# Patient Record
Sex: Female | Born: 1980 | Race: Black or African American | Hispanic: No | Marital: Married | Smoking: Never smoker
Health system: Southern US, Community
[De-identification: ages and names within clinical notes are randomized; demographics above are authoritative.]

## PROBLEM LIST (undated history)

## (undated) DIAGNOSIS — R7303 Prediabetes: Secondary | ICD-10-CM

## (undated) DIAGNOSIS — E785 Hyperlipidemia, unspecified: Secondary | ICD-10-CM

## (undated) DIAGNOSIS — N189 Chronic kidney disease, unspecified: Secondary | ICD-10-CM

## (undated) DIAGNOSIS — E559 Vitamin D deficiency, unspecified: Secondary | ICD-10-CM

## (undated) DIAGNOSIS — O139 Gestational [pregnancy-induced] hypertension without significant proteinuria, unspecified trimester: Secondary | ICD-10-CM

## (undated) HISTORY — DX: Vitamin D deficiency, unspecified: E55.9

## (undated) HISTORY — PX: BREAST SURGERY: SHX581

## (undated) HISTORY — DX: Prediabetes: R73.03

## (undated) HISTORY — DX: Chronic kidney disease, unspecified: N18.9

## (undated) HISTORY — PX: BREAST REDUCTION SURGERY: SHX8

## (undated) HISTORY — DX: Hyperlipidemia, unspecified: E78.5

---
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
## 2001-11-01 ENCOUNTER — Emergency Department (HOSPITAL_COMMUNITY): Admission: EM | Admit: 2001-11-01 | Discharge: 2001-11-01 | Payer: Self-pay | Admitting: *Deleted

## 2002-09-05 ENCOUNTER — Emergency Department (HOSPITAL_COMMUNITY): Admission: EM | Admit: 2002-09-05 | Discharge: 2002-09-05 | Payer: Self-pay | Admitting: Emergency Medicine

## 2006-02-15 ENCOUNTER — Ambulatory Visit: Payer: Self-pay | Admitting: *Deleted

## 2006-02-19 ENCOUNTER — Ambulatory Visit: Payer: Self-pay | Admitting: Obstetrics & Gynecology

## 2006-02-22 ENCOUNTER — Ambulatory Visit (HOSPITAL_COMMUNITY): Admission: RE | Admit: 2006-02-22 | Discharge: 2006-02-22 | Payer: Self-pay | Admitting: Obstetrics & Gynecology

## 2006-03-01 ENCOUNTER — Ambulatory Visit: Payer: Self-pay | Admitting: Gynecology

## 2006-03-15 ENCOUNTER — Ambulatory Visit: Payer: Self-pay | Admitting: Gynecology

## 2006-04-12 ENCOUNTER — Ambulatory Visit (HOSPITAL_COMMUNITY): Admission: RE | Admit: 2006-04-12 | Discharge: 2006-04-12 | Payer: Self-pay | Admitting: Obstetrics and Gynecology

## 2006-04-26 ENCOUNTER — Inpatient Hospital Stay (HOSPITAL_COMMUNITY): Admission: AD | Admit: 2006-04-26 | Discharge: 2006-05-05 | Payer: Self-pay | Admitting: Obstetrics and Gynecology

## 2006-04-29 ENCOUNTER — Encounter (INDEPENDENT_AMBULATORY_CARE_PROVIDER_SITE_OTHER): Payer: Self-pay | Admitting: Specialist

## 2006-04-29 ENCOUNTER — Ambulatory Visit: Payer: Self-pay | Admitting: Neonatology

## 2008-03-19 ENCOUNTER — Other Ambulatory Visit: Admission: RE | Admit: 2008-03-19 | Discharge: 2008-03-19 | Payer: Self-pay | Admitting: Gynecology

## 2010-02-25 ENCOUNTER — Other Ambulatory Visit: Admission: RE | Admit: 2010-02-25 | Discharge: 2010-02-25 | Payer: Self-pay | Admitting: Obstetrics & Gynecology

## 2010-02-25 ENCOUNTER — Ambulatory Visit: Payer: Self-pay | Admitting: Obstetrics & Gynecology

## 2011-02-17 NOTE — Op Note (Signed)
NAMEGEORGETTE, Sarah Ayala              ACCOUNT NO.:  [id number]   MEDICAL RECORD NO.:  [id number]          PATIENT TYPE:  INP   LOCATION:  9371                          [facility]ILITY:  [facility]   PHYSICIAN:  Janine Limbo, M.D.DATE OF BIRTH:  Oct 19, 1980   DATE OF PROCEDURE:  04/29/2006  DATE OF DISCHARGE:                                 OPERATIVE REPORT   PREOPERATIVE DIAGNOSES:  1.  A 27-week and 2-day gestation.  2.  Twins.  3.  Intrauterine fetal demise of twin B.  4.  Severe preeclampsia.  5.  Previous cesarean section.   POSTOPERATIVE DIAGNOSES:  1.  A 27-week and 2-day gestation.  2.  Twins.  3.  Intrauterine fetal demise of twin B.  4.  Severe preeclampsia.  5.  Previous cesarean section.   PROCEDURE:  Repeat low transverse cesarean section.   SURGEON:  Janine Limbo, M.D.   FIRST ASSISTANT:  Philipp Deputy, certified nurse midwife.   ANESTHETIC:  Spinal.   DISPOSITION:  Sarah Ayala is a 30 year old female, gravida 2, para 0-1-0-1,  who was admitted to the [facility] [place] on April 26, 2006.  The patient has been followed at the [facility] and  [facility].  She presented to the  office on that day, and an ultrasound showed an intrauterine fetal demise of  the second twin.  She was noted to have proteinuria and hypertension.  A  diagnosis of preeclampsia was given.  The patient had preeclampsia with her  first pregnancy.  At 29 weeks' gestation, she had a cesarean delivery  because of a nonreassuring fetal heart rate tracing.  The patient was  admitted and was given betamethasone.  She was started on magnesium.  Her 24-  hour urine protein returned showing 8645 mg of protein per 24 hours.  Her  creatinine clearance was 89.  On April 29, 2006, the patient was felt to have  received significant benefit from her betamethasone.  Because of the  assigned diagnosis of severe preeclampsia and because of our  concern for  compromise of her renal function, and because of our concern for the well  being of the patient's living fetus, we recommended that the patient proceed  at this time with cesarean delivery.  The risks and benefits of each of her  options were outlined.  The patient agreed to proceed with cesarean  delivery.  The specific risks of C-section were reviewed including, but not  limited to, anesthetic complications, bleeding, infections, and possible  damage to the surrounding organs.   FINDINGS:  The first infant delivered was a 736-gram (1 pound 9.9 ounce)  female, named [place].  The Apgars were 5 at 1 minute and 8 at 5 minutes.  The infant was delivered from a cephalic presentation.  The second infant  delivered was a female that was nonviable.  The Apgars were 0 and 0.  The  infant had already begun to have breakdown of the skin.  Grossly, there  appeared to be a slight abnormality to the facies of the second infant.  The  uterus, fallopian tubes, and the ovaries appeared normal.   PROCEDURE:  The patient was taken to the operating room where a spinal  anesthetic was given.  The patient's abdomen was prepped with multiple  layers of Betadine and then sterilely draped.  A Foley catheter had  previously been placed.  The lower abdomen was injected with 10 mL of 0.5%  Marcaine with epinephrine.  A low transverse incision was made and carried  sharply through the subcutaneous tissue, the fascia, and the anterior  peritoneum.  The bladder flap was developed.  An incision was made in the  lower uterine segment and extended transversely.  The membranes were  ruptured, and the fetus was delivered from a cephalic presentation.  The  cord was clamped and cut.  The infant was handed to the waiting pediatric  team.  Routine cord blood studies were obtained.  The second infant was then  delivered after membranes were ruptured.  There was no foul smell to the  fluid.  The infant was noted  to be an intrauterine fetal demise, and the  skin was already noted to have some breaking down.  The infant was placed to  the side, so that mom could see the infant at a later time.  The placenta  was removed.  The uterine cavity was cleaned of amniotic fluid, clotted  blood, and membranes.  The uterine incision was closed using a running  locking suture of 2-0 Vicryl followed by an imbricating suture of 2-0  Vicryl.  The pelvis was vigorously irrigated.  Hemostasis was adequate.  The  anterior peritoneum and abdominal musculature were reapproximated in the  midline using 2-0 Vicryl.  The fascia was irrigated.  The fascia was closed  using a running suture of 0 Vicryl, followed by three interrupted sutures of  0 Vicryl.  The subcutaneous layer was closed using a running suture of 0  Vicryl.  The skin was reapproximated using a subcuticular suture of 3-0  Monocryl.  Sponge, needle, and instrument counts were correct on two  occasions.  The estimated blood loss for the procedure was 600 mL.  The  patient tolerated her procedure well.  The viable infant was sent to the  neonatal intensive care nursery in stable but guarded condition.  The infant  that was an intrauterine fetal demise was taken to the operating suite so  that mom could view the baby at a later time.  The placenta was sent to  pathology for evaluation.  The patient was noted to drain clear yellow  urine.      Janine Limbo, M.D.  Electronically Signed     AVS/MEDQ  D:  04/30/2006  T:  04/30/2006  Job:  [id number]

## 2011-02-17 NOTE — Discharge Summary (Signed)
NAMEARNITA, Ayala              ACCOUNT NO.:  [id number]   MEDICAL RECORD NO.:  [id number]          PATIENT TYPE:  INP   LOCATION:  9371                          [facility]   PHYSICIAN:  Hal Morales, M.D.DATE OF BIRTH:  [birth year]   DATE OF ADMISSION:  04/26/2006  DATE OF DISCHARGE:  05/05/2006                                 DISCHARGE SUMMARY   ADMITTING DIAGNOSES:  1. Twin intrauterine pregnancy at 26-6/7 weeks.  2. Fetal demise of twin B.  3. Preeclampsia.  4. History of severe preeclampsia.  5. History of previous low transverse cesarean section.   DISCHARGE DIAGNOSES:  1. Twin intrauterine pregnancy at 26-6/7 weeks.  2. Fetal demise of twin B.  3. Preeclampsia.  4. History of severe preeclampsia.  5. History of previous low transverse cesarean section.  6. Thrombocytopenia  7. Persistent elevated blood pressure, possible chronic hypertension.   PROCEDURE:  Repeat low transverse cesarean section.   HOSPITAL COURSE:  [name] is a 30 year old gravida 2, para 0-1-0-1, who  was admitted at 102 weeks and 6 days gestation for evaluation secondary to 3+  proteinuria on clean catch urine specimen at [facility] office,  as well as a twin pregnancy with IUFD of twin B. The patient's blood  pressure in the office was reported to be 130s/80s.  Due to the patient's  history of severe preeclampsia, the patient was sent maternity admissions  for further evaluation of the proteinuria as well as serial blood pressures.  The patient's pregnancy has been remarkable for 1) twin gestation, 2) IUFD  of twin B, 3) history of severe preeclampsia at 29 weeks, 4) history of  prior cesarean section.   ADMISSION LABORATORY:  Cath urinalysis was greater than 300 mg of protein.  Normal PT and PTT.  CBC values - white blood cell count is 10.5, hemoglobin  11.6, hematocrit 34.3, platelets 216,000.  Complete metabolic panel was  within normal limits with the exception of  glucose elevated at 107 and  creatinine at 1.4.  SGOT was slightly elevated at 46, SGPT was normal at 30.  LDH elevated at 361 and uric acid was 7.1.   Blood pressure in maternity admissions was in the 140s to 150s/90 to 110.  The plan at that time was admission with magnesium sulfate therapy as well  as betamethasone x2, 12 hours apart.  The discussion took place with the  parents regarding probable need for early delivery to be based on daily  laboratory evaluation as well as maternal blood pressure control and fetal  status.  On hospital day #1, the patient's blood pressures continued to be  elevated averaging approximately 145/100.  Her weight was 256.5 pounds.  On  the day of admission was 246.  Urine output ranged between 25 and 350 mL per  hour.  Laboratory values on day 1 were similar to admission laboratories.  D-  dimer was elevated at 2.23.  By hospital day #2, laboratory values remained  stable.  Prothrombin II was negative.  ANA value was negative.  Fetal heart  rate remained stable.  The patient  had received both of her betamethasone  doses.  Urine output remained between 50 and 150 mL per hour.  Her 24-hour  urine was almost finished being collected.  By hospital day 3 her magnesium  sulfate was at 1 gram per hour.  The patient was in a positive fluid  balance.  Weight was 258 pounds at that point.  Blood sugars were slightly  elevated.  Fasting was 101.  Postprandial values were in the 120s.  Fetal  heart rate was stable.  A 24-hour urine protein was 8645 mg in the 24 hours  specimen.  Creatinine clearance was 89.  That afternoon a discussion was  held with the patient and her husband about management options.  Due to her  severe preeclampsia and elevated creatinine and BUN, it was recommended that  she proceed with cesarean section for delivery due to the patient's desire  for a repeat C-section as delivery mode.  The risks and benefits were reviewed with the patient and  they accepted the  risks associated with pre-term C-section delivery.  Infant was a viable  female by the name of Sarah Ayala, weight was 736 grams, Apgars were 5 and 1.  Twin B was delivered as well during the C-section as an IUFD of a female  infant with weight of 660 grams.  The patient tolerated the C-section well  and was taken to the recovery room in good condition.  Twin A was taken to  the NICU and was stable.  By postop day #1, which was hospital day #4, the  patient was feeling well.  Blood pressures remained elevated 140-158/85-108.  She was still in a positive fluid balance.  Hemoglobin was 11.9 and had been  11.5 preoperatively.  Magnesium level was 6.  Patient was receiving IV  labetalol for blood pressure control and was 149/104 after 4 doses.  HCTZ  was given concurrently as well for blood pressure control.  By postop day  #2, which was hospital day #5, the patient was feeling well.  The baby was  stable in the NICU.  Temperature max was 100.1.  Blood pressure in the 130-  160s/90-110s.  Weight was 260 pounds.  Mag sulfate was continued at 1.5 gram  per hour.  Platelets had dropped to 103,000 and had been 152,000 the day  prior.  She was positive 660 mL of fluid in the past 24 hours.  By postop  day #3, patient had been ambulating and sitting in the chair.  She denied  signs and symptoms of preeclampsia.  Infant remained stable in the NICU.  Blood pressures were 135-169/86-108.  The patient still requiring IV  labetalol for blood pressure control.  Daily weight was 258 pounds.  Platelets are 100,000.  SGOT was slightly elevated from the day prior at 55  and had been 49.  Magnesium was at 1 gram per hour and labetalol p.o. dosing  was 200 mg b.i.d.  The patient was given permission to visit the infant in  the NICU.  By postop day #4, the patient was able to ambulate without  difficulty and infant was taken off the ventilator and was doing well in the NICU.  Blood pressures were  129-156/84-108 still requiring periodic doses of  IV labetalol.  Fluid balance was negative, liter and a half in the past 24  hours.  Platelets 106,000.  Magnesium was to be continued at 1 gram with  repeat lab work in the morning.  By postop day #5, the  patient was feeling  well and was wanting to go home.  Blood pressures were in the 140-160/90-  100s.  Platelets 112,000.  SGOT was 66.  SGPT was 50.  LDH was 477, which  was a decrease from the day prior which was 486.  At this point, it was  decided that magnesium would be discontinued.  The p.o. labetalol dose was  to be increased to 300 mg b.i.d.  By postop day #6, which was hospital day  #9, the patient was feeling well.  Reported the NICU infant was also doing  well, still remaining off the ventilator.  The patient was pumping breast  milk.  Blood pressure was 138/92.  Her incision was healing well.  Orthostatic vial signs; lying 153/105 with pulse of 97, sitting 155/108 and  pulse of 90, standing 154/107 and pulse of 98.  Weight was 251.5 pounds and  had been 257.5 the day prior.  Fluid balance was negative, 1200 mL.  Blood  pressure medication had been changed to Procardia 20 mg q.8 h and 1 dose of  HCTZ was held on August 3 and that was started back.  Patient was deemed to  have received the full benefit of her hospital stay and was discharged home.   DISCHARGE INSTRUCTIONS:  Per [facility] handout as well as  preeclampsia precautions are reviewed with the patient.   DISCHARGE MEDICATIONS:  1. Motrin 600 mg, 1 p.o. q.6 h., p.r.n. for pain.  2. Tylox 1-2 p.o. q.3-4h., p.r.n. for pain.  3. Procardia 20 mg, 1 p.o. q.6 h.  4. HCTZ 25 mg, 1 p.o. daily.  5. Prenatal vitamin 1 p.o. daily.   DISCHARGE FOLLOW UP:  Will occur in 4 days at [facility] for  blood pressure check with Dr. Pennie Rushing.      Cam Hai, C.N.M.      Hal Morales, M.D.  Electronically Signed    KS/MEDQ  D:  05/05/2006  T:   05/05/2006  Job:  [id number]

## 2011-02-17 NOTE — Op Note (Signed)
[facility]  Patient:    Sarah Ayala, Sarah Ayala                     MRN: [id number] Proc. Date: 11/01/00 Adm. Date:  [id number] Attending:  Tammi Sou                           Operative Report  PREOPERATIVE DIAGNOSIS:       Intrauterine pregnancy at 29-1/[redacted] weeks gestation complicated by severe preeclampsia and repetitive fetal heart rate decelerations.  POSTOPERATIVE DIAGNOSIS:      Intrauterine pregnancy at 29-1/[redacted] weeks gestation complicated by severe preeclampsia and repetitive fetal heart rate decelerations.  OPERATION:                    Low transverse cesarean.  SURGEON:                      Conni Elliot, M.D.  ANESTHESIA:                   Spinal.  OPERATIVE FINDINGS:           A 2 lb 1 oz female with Apgars of 4 and 7.  Cord pH was 7.27.  The placenta was sent to pathology.  OPERATIVE PROCEDURE:          Under satisfactory spinal anesthetic with the patient supine left lateral tilt position receiving oxygen, the abdomen was prepped and draped in a sterile fashion.  A low transverse Pfannenstiel incision was made and extended down through the skin and fascia.  The rectus muscles were separated in the midline.  A bladder flap was created.  A low transverse uterine incision was made.  The baby was delivered from the vertex position without difficulty.  The cord was doubly clamped and cut and the baby handed to the neonatologist in attendance.  The placenta delivered spontaneously.  The uterus, bladder flap, anterior peritoneum, fascia and the skin was closed in normal fashion.  Estimated blood loss was approximately 1000 cc without replacement. DD:  11/01/00 TD:  11/01/00 Job: [id number]

## 2011-02-17 NOTE — Discharge Summary (Signed)
[facility]  Patient:    Sarah Ayala, Sarah Ayala                     MRN: [id number] Adm. Date:  [id number] Disc. Date: [id number] Attending:  Tammi Sou Dictator:   Dallas Breeding, M.D.                           Discharge Summary  DISCHARGE DIAGNOSES:          1. Status post low transverse cesarean section.                               2. Severe preeclampsia, resolved.                               3. Mild hypertension.  PROCEDURES:                   Status post low transverse cesarean section.                               Ultrasound on October 30, 2000.  CONSULTS:                     None.  SIGNIFICANT LABORATORIES:     Hematocrit at discharge 28.8. LFTs are within normal limits as is uric acid.  HISTORY AND PHYSICAL:         Please see dictated.  HOSPITAL COURSE:              An 30 year old, G1, P0, at 57 weeks by LMP consistent with second trimester ultrasound, referred from [facility] where she was noted to have a 24-pound weight gain in the last month, elevated blood pressures, and 3+ proteinuria by dipstick. Initial PIH labs were within normal limits. She was admitted to the AICU where she was begun on a magnesium drip. On January 29 the patient had an ultrasound that demonstrated an AFI of 10.8 and absent diastolic flow. On January 30 her 24-hour urine protein was 3.18 grams, and blood pressures remained stable. On January 31 the patient began to have late decelerations and thus, a cesarean was performed. A viable female infant was delivered at 2 pounds 1 ounce with Apgars of 4 and 7. Cord pH was 7.27.  The patient was continued on 24 hours of magnesium postoperatively with brisk diuresis and continued normalization of pressures (systolics 130s to 140s, diastolics 70s to 80s). At the time of discharge, the patient is asymptomatic and with continued high normal to mildly hypertensive blood pressures. At the time of  discharge, her baby is in the NICU and is extubated. Regarding birth control, the patient desires Depo-Provera and this will be arranged.  DISCHARGE MEDICATIONS:        1. Ibuprofen 600 mg q.6h. p.r.n.                               2. Iron sulfate 325 mg q.d.                               3. Prenatal vitamins q.d.  4. HCTZ one half tablet q.a.m. x 2 weeks.  DISCHARGE INSTRUCTIONS:       Routine. The patient will have blood pressures checked at MAU when visiting her child and house officer will be contacted for blood pressure greater than 145/90.  FOLLOWUP:                     Follow up at [facility] in six weeks. DD:  11/04/00 TD:  11/04/00 Job: [id number]

## 2011-02-17 NOTE — H&P (Signed)
[name], [name]              ACCOUNT NO.:  [id number]   MEDICAL RECORD NO.:  [id number]          PATIENT TYPE:  INP   LOCATION:  9371                          [facility]ILITY[facility]  [facility]   PHYSICIAN:  Sarah Ayala Ayala, M.D.DATE OF BIRTH:  [birth year]   DATE OF ADMISSION:  04/26/2006  DATE OF DISCHARGE:                                HISTORY & PHYSICAL   HISTORY OF PRESENT ILLNESS:  [name] Ayala is a 30 year old gravida 2, para 0-1-  0-1, who is admitted at 70 weeks and 6 days' gestation for evaluation  secondary to 3+ proteinuria on clean-catch urine specimen at [facility] office, as  well as twin pregnancy with IUFD of twin B.  The patient's blood pressure in  the office was reported to be 130s over 80s.  Patient with a history of  severe preeclampsia; therefore, the patient was sent to Maternity Admissions  for pregnancy-induced hypertension labs and serial blood pressures.  Upon  admission to Maternity Admissions, blood pressure was noted to be in the  150s to 160s with diastolics in the 100s to 110s.  The patient denies  headache, vision changes or right upper quadrant pain.  The patient's  pregnancy is remarkable for:  (1) Twin gestation, (2) IUFD of twin B, (3)  history of severe preeclampsia with delivery at 29 weeks, (4) history of  prior cesarean section.  The patient denies any contractions, leakage or  fluid or bleeding and reports that the remaining fetus is moving normally.   PRENATAL LABORATORY DATA:  Initial 12.5, hematocrit 35.8 and platelets  319,000.  Blood type B positive, antibody screen negative.  Sickle cell  trait negative.  Rubella titer immune.  Hepatitis B surface antigen  negative.  HIV nonreactive.  Urine culture negative.  Pap smear within  normal limits.  Gonorrhea and Chlamydia cultures negative.  Varicella titer  is immune.  Quad screen results is not noted within the prenatal record.   HISTORY OF PRESENT PREGNANCY:  The patient's initially began prenatal care  at  14 weeks at [facility], patient's LMP October 21, 2005 with [facility]  July 28, 2006.  The patient had quad screen as well as ANA, lupus  anticoagulant and anticardiolipin antibodies drawn at 16 weeks during this  pregnancy.  The patient also collected baseline 24-hour urine as well at 16  weeks.  At that time, the patient was recommended to begin baby aspirin  daily.  The patient underwent ultrasound at 18 weeks, revealing concordant  growth.  The patient transferred from [facility] to [facility]  OB[facility]N at 22 weeks' gestation, patient's pregnancy followed at [facility] by the  MD service.  The patient's pregnancy had otherwise been unremarkable and the  patient reports that she had an ultrasound done at 24 weeks with normal  growth at that time of both twins; however, those results are not available  at the moment.   OBSTETRICAL HISTORY:  Pregnancy #1 -- primary low transverse cesarean  section at 21 weeks' gestation, female infant, 2 pounds 1 ounce, secondary to  severe preeclampsia and non-reassuring fetal heart tones.  Pregnancy #2 is  current.   GYNECOLOGICAL HISTORY:  The patient reports regular monthly menses.  The  patient denies history of abnormal Paps or STDs.  The patient reports that  she has used OCPs prior to her first pregnancy and Depo-Provera after her  first pregnancy, Velivet OCPs.   MEDICAL HISTORY:  Patient with slightly elevated blood pressures for 1 month  after her cesarean and then medicines discontinued.  Patient with a history  of anemia.   SURGICAL HISTORY:  Cesarean section in 2002.   FAMILY HISTORY:  Maternal grandmother with diabetes, mother with migraines.   GENETIC HISTORY:  Negative.   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  The patient is single; however, the father of  the baby, Sarah Ayala Ayala, is involved and supportive.  The patient denies  alcohol, tobacco or street drug  use.  The patient's domestic violence screen  is negative.   PHYSICAL EXAMINATION:  VITAL SIGNS:  The patient is afebrile.  Vital signs  are stable with the exception of blood pressures, elevated as noted above.  HEENT:  Within normal limits.  LUNGS:  Clear.  HEART:  Regular rate and rhythm.  BREASTS:  Soft.  ABDOMEN:  Soft, gravid and nontender with fundal height consistent with 28  cm.  Fetal heart tones of twin A are noted to be present in the 150s with  variability present; however, no accelerations are noted and no  decelerations are noted.  No uterine contractions are noted on tocometry.  PELVIC:  Cervical exam is deferred.  EXTREMITIES:  With 2+ edema.  Deep tendon reflexes are 3+ with 1 beat of  clonus on the right, negative Homans sign bilaterally.   LABORATORY DATA:  Cath urine with greater than 300 mg of protein noted to be  present, specific gravity 1.020, otherwise negative.  Patient's PT within  normal limits, PTT within normal limits.  CBC:  WBC is 10.5, hemoglobin  11.6, hematocrit 34.3, platelets 216,000.  Complete metabolic panel within  normal limits with the exception of glucose elevated at 107, creatinine  elevated at 1.4, SGOT slightly elevated at 46, SGPT normal at 30, LDH  elevated at 361 and uric acid elevated at 7.1.   ASSESSMENT:  1. Twin intrauterine pregnancy at 26-6/7 weeks.  2. Fetal demise, Twin B.  3. Preeclampsia.  4. History of severe preeclampsia.  5. History of previous low transverse cesarean section.   PLAN:  Per Dr. Pennie Ayala, the patient is to be admitted to [facility].  The patient will be started on magnesium for seizure prophylaxis.  Patient  to begin course of betamethasone for fetal lung maturity.  The patient will  also be started on labetalol as needed for elevated pressures.  Remainder of  thrombophilia workup also ordered  and pending.  The patient will have repeat PIH labs on a daily basis as well as daily coagulations.   Plan of care was discussed with the patient and the  father of the baby by Dr. Pennie Ayala including the probably need for early  delivery secondary to preeclampsia.      Rhona Leavens, CNM      Sarah Ayala Ayala, M.D.  Electronically Signed    NOS/MEDQ  D:  04/27/2006  T:  04/27/2006  Job:  [id number]

## 2011-07-27 ENCOUNTER — Other Ambulatory Visit: Payer: Self-pay | Admitting: Nurse Practitioner

## 2011-07-27 ENCOUNTER — Other Ambulatory Visit (HOSPITAL_COMMUNITY)
Admission: RE | Admit: 2011-07-27 | Discharge: 2011-07-27 | Disposition: A | Discharge status: Home / Self Care | Payer: PRIVATE HEALTH INSURANCE | Source: Ambulatory Visit | Attending: Obstetrics and Gynecology | Admitting: Obstetrics and Gynecology

## 2011-07-27 DIAGNOSIS — Z01419 Encounter for gynecological examination (general) (routine) without abnormal findings: Secondary | ICD-10-CM | POA: Insufficient documentation

## 2011-07-27 DIAGNOSIS — Z113 Encounter for screening for infections with a predominantly sexual mode of transmission: Secondary | ICD-10-CM | POA: Insufficient documentation

## 2011-08-07 ENCOUNTER — Other Ambulatory Visit: Payer: Self-pay | Admitting: Family Medicine

## 2011-08-07 DIAGNOSIS — E010 Iodine-deficiency related diffuse (endemic) goiter: Secondary | ICD-10-CM

## 2011-08-31 ENCOUNTER — Other Ambulatory Visit: Payer: PRIVATE HEALTH INSURANCE

## 2011-09-01 ENCOUNTER — Other Ambulatory Visit: Payer: PRIVATE HEALTH INSURANCE

## 2012-12-24 ENCOUNTER — Other Ambulatory Visit: Payer: Self-pay | Admitting: Family Medicine

## 2012-12-24 ENCOUNTER — Other Ambulatory Visit (HOSPITAL_COMMUNITY)
Admission: RE | Admit: 2012-12-24 | Discharge: 2012-12-24 | Disposition: A | Discharge status: Home / Self Care | Payer: 59 | Source: Ambulatory Visit | Attending: Family Medicine | Admitting: Family Medicine

## 2012-12-24 DIAGNOSIS — Z0000 Encounter for general adult medical examination without abnormal findings: Secondary | ICD-10-CM | POA: Insufficient documentation

## 2012-12-24 DIAGNOSIS — E010 Iodine-deficiency related diffuse (endemic) goiter: Secondary | ICD-10-CM

## 2012-12-26 ENCOUNTER — Ambulatory Visit
Admission: RE | Admit: 2012-12-26 | Discharge: 2012-12-26 | Disposition: A | Discharge status: Home / Self Care | Payer: 59 | Source: Ambulatory Visit | Attending: Family Medicine | Admitting: Family Medicine

## 2012-12-26 DIAGNOSIS — E010 Iodine-deficiency related diffuse (endemic) goiter: Secondary | ICD-10-CM

## 2013-06-17 ENCOUNTER — Other Ambulatory Visit: Payer: Self-pay

## 2013-06-17 LAB — OB RESULTS CONSOLE HIV ANTIBODY (ROUTINE TESTING): HIV: NONREACTIVE

## 2013-06-17 LAB — OB RESULTS CONSOLE RUBELLA ANTIBODY, IGM: RUBELLA: IMMUNE

## 2013-06-17 LAB — OB RESULTS CONSOLE RPR: RPR: NONREACTIVE

## 2013-06-17 LAB — OB RESULTS CONSOLE HEPATITIS B SURFACE ANTIGEN: Hepatitis B Surface Ag: NEGATIVE

## 2013-06-27 ENCOUNTER — Encounter (HOSPITAL_COMMUNITY): Payer: Self-pay | Admitting: Obstetrics and Gynecology

## 2013-07-08 ENCOUNTER — Ambulatory Visit (HOSPITAL_COMMUNITY)
Admission: RE | Admit: 2013-07-08 | Discharge: 2013-07-08 | Disposition: A | Discharge status: Home / Self Care | Payer: 59 | Source: Ambulatory Visit | Attending: Obstetrics and Gynecology | Admitting: Obstetrics and Gynecology

## 2013-07-08 ENCOUNTER — Encounter (HOSPITAL_COMMUNITY): Payer: Self-pay

## 2013-07-08 VITALS — BP 132/87 | HR 92 | Wt 217.0 lb

## 2013-07-08 DIAGNOSIS — O361990 Maternal care for other isoimmunization, unspecified trimester, not applicable or unspecified: Secondary | ICD-10-CM | POA: Insufficient documentation

## 2013-07-08 DIAGNOSIS — IMO0002 Reserved for concepts with insufficient information to code with codable children: Secondary | ICD-10-CM | POA: Insufficient documentation

## 2013-07-08 DIAGNOSIS — O361911 Maternal care for other isoimmunization, first trimester, fetus 1: Secondary | ICD-10-CM

## 2013-07-08 DIAGNOSIS — IMO0001 Reserved for inherently not codable concepts without codable children: Secondary | ICD-10-CM | POA: Insufficient documentation

## 2013-07-08 NOTE — Progress Notes (Signed)
MATERNAL FETAL MEDICINE CONSULT  Patient Name: Sarah Ayala Medical Record Number:  [id number] Date of Birth: Mar 27, 1981 Requesting Physician Name:  Sarah Ayala, CNM Date of Service: 07/08/2013  Chief Complaint History of early preeclampsia x2, Anti-E alloimmunization  History of Present Illness Sarah Ayala was seen today secondary to a history of early preeclampsia x2, anti-E alloimmunization at the request of Sarah Ayala, CNM.  The patient is a 32 y.o. [id number] [redacted]w[redacted]d with an EDD of 02/14/2014, by Last Menstrual Period dating method.  She has a history of preeclampsia in her first pregnancy at 29 weeks and in her second at 26 weeks.  Her second pregnancy was also a twin gestation and there was an IUFD of one twin, which prompted a repeat LTCS for delivery of the remaining twin.  She was incidentally found to have an anti-E titer of 8 on her NOB labs.  She has no history of blood transfusion.  Review of Systems Pertinent items are noted in HPI.  Patient History OB History  Gravida Para Term Preterm AB SAB TAB Ectopic Multiple Living  3 2  2     1 2     # Outcome Date GA Lbr Len/2nd Weight Sex Delivery Anes PTL Lv  3 CUR           2A PRE  [redacted]w[redacted]d    LTCS   Y  2B       LTCS   N  1 PRE  [redacted]w[redacted]d    LTCS   Y      Past Medical and Surgical History The patient has no history of chronic medical diseases or prior surgeries.  History   Social History  . Marital Status: Married    Spouse Name: N/A    Number of Children: N/A  . Years of Education: N/A   Social History Main Topics  . Smoking status: None  . Smokeless tobacco: None  . Alcohol Use: None  . Drug Use: None  . Sexual Activity: None   Other Topics Concern  . None   Social History Narrative  . None    Family History [name] has no family history of mental retardation, birth defects, or genetic diseases.   Assessment and Recommendations 1.  History of early onset preeclampsia x2.  There is considerable  risk of recurrence in this pregnancy given [name]'s past history.  I instructed her to start taking 81 mg of aspirin daily as this has been showed to modestly decrease the risk of recurrent preeclampsia.  Early onset preeclampsia is also a diagnostic criteria for anti-phospholipid syndrome, so I have ordered labs to assess for this in [name].  If anti-phospholipid syndrome is confirmed I also suggest starting [name] on prophylactic unfractionated or low-molecular weight heparin to decrease the risk of miscarriage. 2.  Anti-E alloimmunization.  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  As she has not had a blood transfusion, I suspect she was sensitized during a previous pregnancy.  We discussed the nature of alloimmunization, the potential for significant fetal anemia, paternal and fetal genotyping, the monitoring for anemia using MCA doppler, and the treatment of alloimmunization with fetal transfusion.  Ms. Doreene Adas partner Cristal Deer Ober) had his blood drawn today to determine his E-antigen status.  If positive he will require genotyping to determine if he is homozygous or heterozygous for the E allele.  If he is homozygous the fetus will be E-antigen positive.  If he is found to be  heterozygous there is a 50% chance the fetus is positive.  In that case an amniocentesis could be performed after 15 weeks to determine fetal antigen status.  If her anti-E titer rises to 16 or higher and there is confirmation or suspicion that the fetus is E positive, she will require frequent MCA doppler assessment thoughout pregnancy, beginning everyone other week.  Frequency should be increased to every week if MCA peak systolic velocities begin to increase, suggesting evolving anemia.  If significant alloimmunization develops she should also begin once or twice weekly fetal testing beginning at 32 weeks, or earlier if evidence of anemia is discovered.  I spent 45 minutes with [name] today of  which 50% was face-to-face counseling.  Thank you for referring [name] to the [facility].  Please do not hesitate to contact us with questions.   Rema Fendt, MD

## 2013-07-09 LAB — LUPUS ANTICOAGULANT PANEL
DRVVT: 36.6 secs (ref <42.9)
Lupus Anticoagulant: NOT DETECTED

## 2013-07-09 NOTE — Addendum Note (Signed)
Encounter addended by: Ty Hilts, RN on: 07/09/2013  8:55 AM<BR>     Documentation filed: Charges VN

## 2013-07-11 ENCOUNTER — Telehealth (HOSPITAL_COMMUNITY): Payer: Self-pay | Admitting: *Deleted

## 2013-07-11 NOTE — Telephone Encounter (Signed)
Pt called back about lab results.  Informed patient that her cardiolipin antibodies, beta 2 glycoprotein, and lupus anti-coag panel came back WNL.  Also informed patient that the FOB's blood work came back positive for the E antigen.  Explained to patient that we will need to draw FOB's blood again for genotyping.  An appointment was made for 10/16 at 1245 for his blood work as well as titers for her anti E.  Pt verbalized understanding.

## 2013-07-17 ENCOUNTER — Ambulatory Visit (HOSPITAL_COMMUNITY)
Admission: RE | Admit: 2013-07-17 | Discharge: 2013-07-17 | Disposition: A | Discharge status: Home / Self Care | Payer: 59 | Source: Ambulatory Visit | Attending: Obstetrics and Gynecology | Admitting: Obstetrics and Gynecology

## 2013-07-17 ENCOUNTER — Other Ambulatory Visit: Payer: Self-pay

## 2013-07-17 DIAGNOSIS — O361190 Maternal care for Anti-A sensitization, unspecified trimester, not applicable or unspecified: Secondary | ICD-10-CM | POA: Insufficient documentation

## 2013-08-03 LAB — OB RESULTS CONSOLE GC/CHLAMYDIA
Chlamydia: NEGATIVE
Gonorrhea: NEGATIVE

## 2013-11-12 ENCOUNTER — Inpatient Hospital Stay (HOSPITAL_COMMUNITY)
Admission: AD | Admit: 2013-11-12 | Discharge: 2013-11-15 | DRG: 782 | Discharge status: Against Medical Advice | Payer: 59 | Source: Ambulatory Visit | Attending: Obstetrics and Gynecology | Admitting: Obstetrics and Gynecology

## 2013-11-12 ENCOUNTER — Encounter (HOSPITAL_COMMUNITY): Payer: Self-pay | Admitting: General Practice

## 2013-11-12 ENCOUNTER — Observation Stay (HOSPITAL_COMMUNITY): Payer: 59

## 2013-11-12 DIAGNOSIS — O365990 Maternal care for other known or suspected poor fetal growth, unspecified trimester, not applicable or unspecified: Secondary | ICD-10-CM | POA: Diagnosis present

## 2013-11-12 DIAGNOSIS — Z3689 Encounter for other specified antenatal screening: Secondary | ICD-10-CM

## 2013-11-12 DIAGNOSIS — O34219 Maternal care for unspecified type scar from previous cesarean delivery: Secondary | ICD-10-CM | POA: Diagnosis present

## 2013-11-12 DIAGNOSIS — O361190 Maternal care for Anti-A sensitization, unspecified trimester, not applicable or unspecified: Secondary | ICD-10-CM | POA: Diagnosis present

## 2013-11-12 DIAGNOSIS — O139 Gestational [pregnancy-induced] hypertension without significant proteinuria, unspecified trimester: Secondary | ICD-10-CM | POA: Diagnosis present

## 2013-11-12 DIAGNOSIS — O368390 Maternal care for abnormalities of the fetal heart rate or rhythm, unspecified trimester, not applicable or unspecified: Secondary | ICD-10-CM | POA: Diagnosis present

## 2013-11-12 DIAGNOSIS — O1490 Unspecified pre-eclampsia, unspecified trimester: Secondary | ICD-10-CM | POA: Diagnosis present

## 2013-11-12 DIAGNOSIS — O09299 Supervision of pregnancy with other poor reproductive or obstetric history, unspecified trimester: Secondary | ICD-10-CM

## 2013-11-12 HISTORY — DX: Gestational (pregnancy-induced) hypertension without significant proteinuria, unspecified trimester: O13.9

## 2013-11-12 LAB — CBC
HCT: 32.5 % — ABNORMAL LOW (ref 36.0–46.0)
HEMOGLOBIN: 11 g/dL — AB (ref 12.0–15.0)
MCH: 28.1 pg (ref 26.0–34.0)
MCHC: 33.8 g/dL (ref 30.0–36.0)
MCV: 83.1 fL (ref 78.0–100.0)
Platelets: 271 10*3/uL (ref 150–400)
RBC: 3.91 MIL/uL (ref 3.87–5.11)
RDW: 15.7 % — AB (ref 11.5–15.5)
WBC: 8.7 10*3/uL (ref 4.0–10.5)

## 2013-11-12 LAB — URINALYSIS, ROUTINE W REFLEX MICROSCOPIC
BILIRUBIN URINE: NEGATIVE
Glucose, UA: NEGATIVE mg/dL
KETONES UR: NEGATIVE mg/dL
LEUKOCYTES UA: NEGATIVE
Nitrite: NEGATIVE
PH: 5.5 (ref 5.0–8.0)
PROTEIN: NEGATIVE mg/dL
Specific Gravity, Urine: 1.01 (ref 1.005–1.030)
UROBILINOGEN UA: 0.2 mg/dL (ref 0.0–1.0)

## 2013-11-12 LAB — COMPREHENSIVE METABOLIC PANEL
ALT: 19 U/L (ref 0–35)
AST: 20 U/L (ref 0–37)
Albumin: 2.3 g/dL — ABNORMAL LOW (ref 3.5–5.2)
Alkaline Phosphatase: 126 U/L — ABNORMAL HIGH (ref 39–117)
BILIRUBIN TOTAL: 0.2 mg/dL — AB (ref 0.3–1.2)
BUN: 12 mg/dL (ref 6–23)
CHLORIDE: 103 meq/L (ref 96–112)
CO2: 21 mEq/L (ref 19–32)
CREATININE: 0.9 mg/dL (ref 0.50–1.10)
Calcium: 8.6 mg/dL (ref 8.4–10.5)
GFR calc Af Amer: >90 mL/min (ref >90)
GFR calc non Af Amer: 84 mL/min — ABNORMAL LOW (ref >90)
Glucose, Bld: 88 mg/dL (ref 70–99)
Potassium: 3.8 mEq/L (ref 3.7–5.3)
Sodium: 135 mEq/L — ABNORMAL LOW (ref 137–147)
Total Protein: 5.9 g/dL — ABNORMAL LOW (ref 6.0–8.3)

## 2013-11-12 LAB — PROTEIN / CREATININE RATIO, URINE
CREATININE, URINE: 66.19 mg/dL
Protein Creatinine Ratio: 0.5 — ABNORMAL HIGH (ref 0.00–0.15)
Total Protein, Urine: 33.4 mg/dL

## 2013-11-12 LAB — ABO/RH: ABO/RH(D): B POS

## 2013-11-12 LAB — URINE MICROSCOPIC-ADD ON

## 2013-11-12 MED ORDER — PRENATAL MULTIVITAMIN CH
1.0000 | ORAL_TABLET | Freq: Every day | ORAL | Status: DC
  Administered 2013-11-13: 1 via ORAL
  Filled 2013-11-12 (×2): qty 1

## 2013-11-12 MED ORDER — LACTATED RINGERS IV SOLN
INTRAVENOUS | Status: DC
  Administered 2013-11-13 – 2013-11-15 (×3): via INTRAVENOUS

## 2013-11-12 MED ORDER — DOCUSATE SODIUM 100 MG PO CAPS
100.0000 mg | ORAL_CAPSULE | Freq: Every day | ORAL | Status: DC
  Administered 2013-11-13: 100 mg via ORAL
  Filled 2013-11-12 (×2): qty 1

## 2013-11-12 MED ORDER — ACETAMINOPHEN 325 MG PO TABS: 650.0000 mg | ORAL_TABLET | ORAL | Status: DC | PRN

## 2013-11-12 MED ORDER — ZOLPIDEM TARTRATE 5 MG PO TABS: 5.0000 mg | ORAL_TABLET | Freq: Every evening | ORAL | Status: DC | PRN

## 2013-11-12 MED ORDER — CALCIUM CARBONATE ANTACID 500 MG PO CHEW: 2.0000 | CHEWABLE_TABLET | ORAL | Status: DC | PRN

## 2013-11-12 MED ORDER — BETAMETHASONE SOD PHOS & ACET 6 (3-3) MG/ML IJ SUSP
12.0000 mg | INTRAMUSCULAR | Status: AC
  Administered 2013-11-12 – 2013-11-13 (×2): 12 mg via INTRAMUSCULAR
  Filled 2013-11-12 (×2): qty 2

## 2013-11-12 NOTE — H&P (Signed)
  33 year old G 3 P 0212 at 26 weeks and 4 days. Poor obstetrical history: first pregnancy complicated by Preeclampsia at 29 weeks and had a C Section. Second pregnancy in 2007 complicated by severe preeclampsia at 26 weeks requiring C Section. That pregnancy was a twin pregnancy and one twin was a IUFD. This pregnancy has been complicated by : 1. Anti - E antibodies - Father is heterozygous for E antigen. She has been evaluated by MFM and the plan currently is to have monthly titres. If the titre is greater than 1 in 16 then a MCA should be performed. Titre drawn today in the office.  2. BP today in the office 144/100 with +1 proteinuria Weight 250 pounds. Weight 234 at 24 weeks. She currently has no PIH Symptoms.  NKDA   Afebrile BP here is pending General alert and oriented  Lung CTAB Car RRR Abdomen is soft and non tender DTR +2 clonus  IMPRESSION: IUP at 26 weeks 4 days Previous C Section x 2 Poor obstetrical history with history of early severe preeclampsia Anti - E antibodies  PLAN: Admit for obs 24 hour urine Betamethasone Serial BPs PIH Labs Ultrasound with MFM and consultation - appreciate their recommendations C Section for delivery if indicated

## 2013-11-12 NOTE — Consult Note (Signed)
MFM Note  Ms. Stann Mainland is a 33 year old G41P2 AA female at 26+[redacted] weeks gestation who was admitted a few hours ago from the office for elevated blood pressures. Earlier in the pregnancy her BPs were quite normal, 120s-130s/70s-80s. Ms. Stann Mainland reported that all was OK until last week when her pressures started to increase, 130s/80s/90s. She also noted an increase in her lower extremity edema. In the office today, her BP was 150/100 with +1 proteinuria.   The only other prenatal complication has been anti-E alloimmunization. Monthly titers have been obtained and to my knowledge, the critical titer of 16 has not been reached. FOB is a heterozygote for E antigen (E,e).  Earlier in the pregnancy, Ms. Stann Mainland met with my partner, Dr. Nelda Severe regarding her history of recurrent, early, severe pre-E. He obtained anti-phospholipid antibodies which were all negative. He suggested starting baby ASA.  Currently, she feels "great" and denies HAs, change in vision, abdominal pain and shortness of breath. She reports excellent fetal movement. To her knowledge, Ms. Stann Mainland does not have chronic hypertension and is normotensive between pregnancies. BPs since admission: 140/95 and 144/100.  OB history:  1) Severe pre-E at 29 weeks; C/S; female - doing well at age 28 2) Severe pre-E at 87 weeks; twin pregnancy with IUFD of a co-twin; female - healthy except for controlled asthma at age 82  Ultrasound this afternoon: singleton; cephalic presentation; normal detailed fetal anatomy; EFW at the 20th %tile, AC < 3rd %tile; normal to low normal AFV; normal UA dopplers  HELLP labs were normal and a 24 hour urine is pending.  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 5) H/O severe, early pre-E at 57 and 26 weeks 6) IUFD of a co-twin at 26 weeks secondary to pre-E 7) S/P C/Ss x  2  Recommendations: 1) Complete course of BMZ 2) Serial assessments of BPs, labs, maternal status and fetal status (follow-up concerning tracings with BPPs and weekly BPPs, AFIs and UA dopplers while in house) 3) Give magnesium sulfate if progresses to severe pre-E for sx px or give if delivery is imminent for sx px and neuropx 4) Treat BPs only if > 160/110 and continue in house management 5) If condition stabilizes over the next few days, can consider outpt management (twice weekly BP checks with weekly BPPs, AFIs, UA dopplers and labs; twice weekly testing if >32 weeks); readmit with any severe features  6) Growth [place] in 3 weeks 7) Deliver by 34 weeks if severe features present; deliver by 37 weeks if remains without severe features; deliver at anytime if the maternal or fetal status deteriorates  8) Continue q 4 week anti-E titers  Please call with any questions. Thank you for the kind referral.  (Face-to-face consultation with patient: 45 min)

## 2013-11-13 LAB — PROTEIN, URINE, 24 HOUR
Collection Interval-UPROT: 24 hours
PROTEIN, URINE: 42 mg/dL
Protein, 24H Urine: 1187 mg/d — ABNORMAL HIGH (ref 50–100)
URINE TOTAL VOLUME-UPROT: 2825 mL

## 2013-11-13 NOTE — Progress Notes (Signed)
Patient ID: Sarah Ayala, female   DOB: 04/16/1981, 33 y.o.   MRN: [id number] S: NO PIH COMPLAINTS O: BP 137/88  TO  150/98      FHT 140'S SOME MILD VARIABLES      SONO YESTERDAY  AMNIOTIC FLUID LOW NORMAL. NORMAL DOPLERS. EFW 20 % TILE      AC < 3 % TILE A: IUP AT 26.5     ANTI-E TITER PENDING     SIGNS OR IUGR     PIH P:  FINISH BETAMETHASONE       CHECK TITER FROM THE OFFICE      REPEAT BPP AND DOPLER LATER THIS WEEK

## 2013-11-13 NOTE — Progress Notes (Signed)
Pt. Request to have VS taken after her shower.

## 2013-11-14 ENCOUNTER — Observation Stay (HOSPITAL_COMMUNITY): Payer: 59

## 2013-11-14 DIAGNOSIS — O139 Gestational [pregnancy-induced] hypertension without significant proteinuria, unspecified trimester: Secondary | ICD-10-CM | POA: Diagnosis present

## 2013-11-14 DIAGNOSIS — O365990 Maternal care for other known or suspected poor fetal growth, unspecified trimester, not applicable or unspecified: Secondary | ICD-10-CM | POA: Diagnosis present

## 2013-11-14 DIAGNOSIS — O34219 Maternal care for unspecified type scar from previous cesarean delivery: Secondary | ICD-10-CM | POA: Diagnosis present

## 2013-11-14 DIAGNOSIS — O09299 Supervision of pregnancy with other poor reproductive or obstetric history, unspecified trimester: Secondary | ICD-10-CM | POA: Diagnosis not present

## 2013-11-14 DIAGNOSIS — O368390 Maternal care for abnormalities of the fetal heart rate or rhythm, unspecified trimester, not applicable or unspecified: Secondary | ICD-10-CM | POA: Diagnosis present

## 2013-11-14 DIAGNOSIS — O361190 Maternal care for Anti-A sensitization, unspecified trimester, not applicable or unspecified: Secondary | ICD-10-CM | POA: Diagnosis present

## 2013-11-14 DIAGNOSIS — IMO0002 Reserved for concepts with insufficient information to code with codable children: Secondary | ICD-10-CM | POA: Diagnosis present

## 2013-11-14 LAB — URIC ACID: Uric Acid, Serum: 5.5 mg/dL (ref 2.4–7.0)

## 2013-11-14 LAB — CBC
HCT: 33.6 % — ABNORMAL LOW (ref 36.0–46.0)
HEMOGLOBIN: 11.2 g/dL — AB (ref 12.0–15.0)
MCH: 27.9 pg (ref 26.0–34.0)
MCHC: 33.3 g/dL (ref 30.0–36.0)
MCV: 83.6 fL (ref 78.0–100.0)
Platelets: 290 10*3/uL (ref 150–400)
RBC: 4.02 MIL/uL (ref 3.87–5.11)
RDW: 15.8 % — ABNORMAL HIGH (ref 11.5–15.5)
WBC: 17.7 10*3/uL — AB (ref 4.0–10.5)

## 2013-11-14 LAB — COMPREHENSIVE METABOLIC PANEL
ALT: 20 U/L (ref 0–35)
AST: 22 U/L (ref 0–37)
Albumin: 2.7 g/dL — ABNORMAL LOW (ref 3.5–5.2)
Alkaline Phosphatase: 144 U/L — ABNORMAL HIGH (ref 39–117)
BUN: 15 mg/dL (ref 6–23)
CHLORIDE: 104 meq/L (ref 96–112)
CO2: 23 meq/L (ref 19–32)
Calcium: 9 mg/dL (ref 8.4–10.5)
Creatinine, Ser: 0.93 mg/dL (ref 0.50–1.10)
GFR, EST NON AFRICAN AMERICAN: 81 mL/min — AB (ref >90)
GLUCOSE: 91 mg/dL (ref 70–99)
Potassium: 4.4 mEq/L (ref 3.7–5.3)
SODIUM: 137 meq/L (ref 137–147)
Total Bilirubin: <0.2 mg/dL — ABNORMAL LOW (ref 0.3–1.2)
Total Protein: 6.5 g/dL (ref 6.0–8.3)

## 2013-11-14 LAB — URINALYSIS, DIPSTICK ONLY
Bilirubin Urine: NEGATIVE
Glucose, UA: NEGATIVE mg/dL
KETONES UR: NEGATIVE mg/dL
Leukocytes, UA: NEGATIVE
NITRITE: NEGATIVE
Protein, ur: 30 mg/dL — AB
Specific Gravity, Urine: 1.01 (ref 1.005–1.030)
UROBILINOGEN UA: 0.2 mg/dL (ref 0.0–1.0)
pH: 6 (ref 5.0–8.0)

## 2013-11-14 MED ORDER — SODIUM CHLORIDE 0.9 % IJ SOLN: 3.0000 mL | INTRAMUSCULAR | Status: DC | PRN

## 2013-11-14 MED ORDER — SODIUM CHLORIDE 0.9 % IJ SOLN
3.0000 mL | Freq: Two times a day (BID) | INTRAMUSCULAR | Status: DC
  Administered 2013-11-14 – 2013-11-15 (×3): 3 mL via INTRAVENOUS

## 2013-11-14 NOTE — Progress Notes (Signed)
MD informed of MFM results and maternal BP's.  POC continues as inpatient care

## 2013-11-14 NOTE — Progress Notes (Signed)
Reviewed with patient and Dr Sherrie Georgeecker U/S, labs, BPs, tracing. Will observe today and reevaluate in am.

## 2013-11-14 NOTE — Progress Notes (Signed)
Returning from MFM and placed back on the monitor, lab in to draw bloodwork

## 2013-11-14 NOTE — Progress Notes (Signed)
26 6/7 weeks No HA, no vision change  Filed Vitals:   11/14/13 0743  BP: 145/85  Pulse: 80  Temp: 98.5 F (36.9 C)  Resp: 18    FHT - intermittent variable decels  Labs 24 hour urine 1187mg /24 hrs protein  A: PIH     IUGR     Poor obstetric history     S/P BMTZ  P: Repeat labs today      AFI and UA doppler today      D/W patient

## 2013-11-15 NOTE — Progress Notes (Signed)
Pt adamant about leaving despite explanation of risks explained by MD. AMA papers sign pt ambulates off of unit with Father of baby

## 2013-11-15 NOTE — Progress Notes (Signed)
Patient wants to go home D/W patient and husband trend to decreasing AFI on U/S, IUGR, decels of FHT noted on monitor early this am. D/W possible unpredictable worsening of fetal condition even if it is positional. If unmonitored at home could go unevaluated. I told patient and husband I am worried the baby could die if she is home and therefore is unmonitored. Also, she has had sever preeclampsia with both of previous pregnancies about same gestational age. D/W her labile BPs while at [facility]. D/W possible rapid progression of preeclampsia that could be life threatening to her or fetus.  I stated I recommend she remain in hospital for further monitoring of her and fetus. She stated she clearly understood above but wanted to go home.  Recommend FU in office Monday am. She said she would. [facility], Reviewed preeclampsia symptoms.

## 2013-11-15 NOTE — Progress Notes (Signed)
Patient calls out states she is leaving now. HC notified, MD notified. Pt voiced her understanding that MD is concern of infant risks and wellbeing. Pt is willing to sign AMA release.

## 2013-11-15 NOTE — Progress Notes (Signed)
27 0/7 weeks No HA, no epigastric pain  Filed Vitals:   11/15/13 0428  BP: 135/94  Pulse: 77  Temp: 98.5 F (36.9 C)  Resp: 18   Abd no epigastric tenderness DTR 2+ without clonus  FHT 150s with intermitent variable decels that are worse when patient on left side, on right side occasional small variable decels  A: PIH     IUGR     Low normal AFI     Previous C/S x 2 for repeat with delivery  P: Continue in patient monitoring      D/W patient      Maintain T&S and IV access

## 2013-11-17 ENCOUNTER — Inpatient Hospital Stay (HOSPITAL_COMMUNITY): Payer: 59

## 2013-11-17 ENCOUNTER — Encounter (HOSPITAL_COMMUNITY): Payer: Self-pay | Admitting: *Deleted

## 2013-11-17 ENCOUNTER — Inpatient Hospital Stay (HOSPITAL_COMMUNITY)
Admission: AD | Admit: 2013-11-17 | Discharge: 2013-11-21 | DRG: 765 | Disposition: A | Discharge status: Home / Self Care | Payer: 59 | Source: Ambulatory Visit | Attending: Obstetrics and Gynecology | Admitting: Obstetrics and Gynecology

## 2013-11-17 ENCOUNTER — Encounter (HOSPITAL_COMMUNITY): Payer: Self-pay | Admitting: Anesthesiology

## 2013-11-17 DIAGNOSIS — O1410 Severe pre-eclampsia, unspecified trimester: Secondary | ICD-10-CM | POA: Diagnosis present

## 2013-11-17 DIAGNOSIS — E669 Obesity, unspecified: Secondary | ICD-10-CM | POA: Diagnosis present

## 2013-11-17 DIAGNOSIS — O99214 Obesity complicating childbirth: Secondary | ICD-10-CM | POA: Diagnosis present

## 2013-11-17 DIAGNOSIS — O361190 Maternal care for Anti-A sensitization, unspecified trimester, not applicable or unspecified: Secondary | ICD-10-CM | POA: Diagnosis present

## 2013-11-17 DIAGNOSIS — O34219 Maternal care for unspecified type scar from previous cesarean delivery: Secondary | ICD-10-CM | POA: Diagnosis present

## 2013-11-17 DIAGNOSIS — D649 Anemia, unspecified: Secondary | ICD-10-CM | POA: Diagnosis present

## 2013-11-17 DIAGNOSIS — O365990 Maternal care for other known or suspected poor fetal growth, unspecified trimester, not applicable or unspecified: Secondary | ICD-10-CM | POA: Diagnosis present

## 2013-11-17 DIAGNOSIS — O1414 Severe pre-eclampsia complicating childbirth: Principal | ICD-10-CM | POA: Diagnosis present

## 2013-11-17 DIAGNOSIS — O1490 Unspecified pre-eclampsia, unspecified trimester: Secondary | ICD-10-CM | POA: Diagnosis present

## 2013-11-17 DIAGNOSIS — O9902 Anemia complicating childbirth: Secondary | ICD-10-CM | POA: Diagnosis present

## 2013-11-17 DIAGNOSIS — IMO0001 Reserved for inherently not codable concepts without codable children: Secondary | ICD-10-CM

## 2013-11-17 DIAGNOSIS — O361990 Maternal care for other isoimmunization, unspecified trimester, not applicable or unspecified: Secondary | ICD-10-CM

## 2013-11-17 DIAGNOSIS — R030 Elevated blood-pressure reading, without diagnosis of hypertension: Secondary | ICD-10-CM | POA: Diagnosis present

## 2013-11-17 LAB — COMPREHENSIVE METABOLIC PANEL
ALT: 29 U/L (ref 0–35)
AST: 41 U/L — ABNORMAL HIGH (ref 0–37)
Albumin: 2.2 g/dL — ABNORMAL LOW (ref 3.5–5.2)
Alkaline Phosphatase: 133 U/L — ABNORMAL HIGH (ref 39–117)
BUN: 14 mg/dL (ref 6–23)
CALCIUM: 8.2 mg/dL — AB (ref 8.4–10.5)
CO2: 22 meq/L (ref 19–32)
CREATININE: 1.06 mg/dL (ref 0.50–1.10)
Chloride: 104 mEq/L (ref 96–112)
GFR calc Af Amer: 80 mL/min — ABNORMAL LOW (ref >90)
GFR calc non Af Amer: 69 mL/min — ABNORMAL LOW (ref >90)
GLUCOSE: 79 mg/dL (ref 70–99)
Potassium: 4 mEq/L (ref 3.7–5.3)
SODIUM: 138 meq/L (ref 137–147)
Total Bilirubin: 0.3 mg/dL (ref 0.3–1.2)
Total Protein: 5.7 g/dL — ABNORMAL LOW (ref 6.0–8.3)

## 2013-11-17 LAB — CBC
HCT: 34 % — ABNORMAL LOW (ref 36.0–46.0)
HEMOGLOBIN: 11.6 g/dL — AB (ref 12.0–15.0)
MCH: 28.2 pg (ref 26.0–34.0)
MCHC: 34.1 g/dL (ref 30.0–36.0)
MCV: 82.5 fL (ref 78.0–100.0)
PLATELETS: 197 10*3/uL (ref 150–400)
RBC: 4.12 MIL/uL (ref 3.87–5.11)
RDW: 15.7 % — ABNORMAL HIGH (ref 11.5–15.5)
WBC: 12.2 10*3/uL — ABNORMAL HIGH (ref 4.0–10.5)

## 2013-11-17 LAB — PREPARE RBC (CROSSMATCH)

## 2013-11-17 LAB — LACTATE DEHYDROGENASE: LDH: 372 U/L — ABNORMAL HIGH (ref 94–250)

## 2013-11-17 LAB — URIC ACID: Uric Acid, Serum: 6 mg/dL (ref 2.4–7.0)

## 2013-11-17 MED ORDER — DOCUSATE SODIUM 100 MG PO CAPS: 100.0000 mg | ORAL_CAPSULE | Freq: Every day | ORAL | Status: DC

## 2013-11-17 MED ORDER — PRENATAL MULTIVITAMIN CH: 1.0000 | ORAL_TABLET | Freq: Every day | ORAL | Status: DC

## 2013-11-17 MED ORDER — ACETAMINOPHEN 325 MG PO TABS
650.0000 mg | ORAL_TABLET | ORAL | Status: DC | PRN
  Administered 2013-11-17 (×2): 650 mg via ORAL
  Filled 2013-11-17 (×2): qty 2

## 2013-11-17 MED ORDER — MAGNESIUM SULFATE 40 G IN LACTATED RINGERS - SIMPLE
2.0000 g/h | INTRAVENOUS | Status: DC
  Administered 2013-11-17: 6 g/h via INTRAVENOUS
  Administered 2013-11-18 – 2013-11-19 (×2): 2 g/h via INTRAVENOUS
  Filled 2013-11-17 (×3): qty 500

## 2013-11-17 MED ORDER — DEXTROSE IN LACTATED RINGERS 5 % IV SOLN
INTRAVENOUS | Status: DC
  Administered 2013-11-18: 04:00:00 via INTRAVENOUS

## 2013-11-17 MED ORDER — MAGNESIUM SULFATE BOLUS VIA INFUSION
6.0000 g | Freq: Once | INTRAVENOUS | Status: DC
  Filled 2013-11-17: qty 500

## 2013-11-17 MED ORDER — ZOLPIDEM TARTRATE 5 MG PO TABS
5.0000 mg | ORAL_TABLET | Freq: Every evening | ORAL | Status: DC | PRN
  Administered 2013-11-17: 5 mg via ORAL
  Filled 2013-11-17: qty 1

## 2013-11-17 MED ORDER — CALCIUM CARBONATE ANTACID 500 MG PO CHEW
2.0000 | CHEWABLE_TABLET | ORAL | Status: DC | PRN
Start: 2013-11-17 — End: 2013-11-18

## 2013-11-17 NOTE — H&P (Signed)
  Patient was sent over for admission from the office where she was seen after leaving AMA from the this weekend's hospital admission.  The patient was admitted to evaluate elevated BPs and ruled in for pre-E with 1100mg  protein and mild range BPs with occasional severes.  She denies signs or symptoms.  Ultrasound showed AC lag and low-normal AFI.  On the day the patient left AMA, repetitive decels were noted on monitoring which did resolve with repositioning.  On follow-up in the office today, ultrasound showed absent end diastolic flow.  I recommended readmission to her.  Additionally, the patient has a poor OB hx secondary to Pre-E with history of 2 previous C/S and Anti-E alloimmunization.  BP 170/94  Gen: A&O x 3 Abd: soft, NT Ext: no c/c, 1+edema  FHT: 150, minimal variability (Cat II) Toco: flat  32yo [id number] at 2961w2d with pre-e, AC lag, abnormal Dopplers in office -NPO -MFM consult -IV -Add mag for neuroppx if delivery is imminent -Tx BPs if repeatedly severe -C/S for delivery -Anti-E alloimmunization -s/p BMZ    Mitchel HonourMegan , DO

## 2013-11-17 NOTE — Consult Note (Signed)
MFM Note  [name] is known to me. I saw her on 02/11 when she was admitted for elevated BPs (in mild range). She was asymptomatic at that time. US showed lagging growth, low normal AFV and normal UA dopplers. Labs were normal except for the 24 hour urine which returned with ~ 1 gram of protein. She was observed closely in house and repeat AFI and UA dopplers on Friday were again WNLs. The tracing, however, was somewhat concerning with episodes of decreased variability and mild variable decelerations. [name] decided to leave the hospital AMA on Saturday and was seen again in the office today. She reports good fetal movement and only a slight headache today. She denies abdominal pain, change in vision or shortness of breath.   Since admission a few hours ago, her BPs have been in the severe range: 166/105, 166/96 and 170/94. Labs had not been drawn yet in that there has been difficulty starting IV access.  Repeat UA dopplers in office and here today revealed no diastolic flow but no reverse flow.  Poor OB history with severe pre-E x 2 at 26 and 29 weeks.  Tracing, again, has been concerning with variable decelerations, occasional shallow decelerations and decreased variability.  Assessment: 1) IUP at 27+2 weeks 2) Preeclampsia with severe features (BPs) 3) Lagging growth with abnormal UA dopplers (absent EDF) 4) Poor OB history as above 5) S/P BMZ 6) S/P C/S x 2 7) Anti-E antibodies  Recommendations 1) Would begin magnesium sulfate 2) Treat persistent severe range BPs; goal 140s-150s/80s-100 3) Deliver by repeat C/S if no improvement in tracing with fluid bolus, etc.  Please call with questions or concerns.  (Face-to-face consultation with patient: 30 min)

## 2013-11-18 ENCOUNTER — Encounter (HOSPITAL_COMMUNITY): Payer: 59 | Admitting: Anesthesiology

## 2013-11-18 ENCOUNTER — Encounter (HOSPITAL_COMMUNITY)
Admission: AD | Disposition: A | Discharge status: Home / Self Care | Payer: Self-pay | Source: Ambulatory Visit | Attending: Obstetrics and Gynecology

## 2013-11-18 ENCOUNTER — Inpatient Hospital Stay (HOSPITAL_COMMUNITY): Payer: 59

## 2013-11-18 ENCOUNTER — Encounter (HOSPITAL_COMMUNITY): Payer: Self-pay | Admitting: Anesthesiology

## 2013-11-18 ENCOUNTER — Inpatient Hospital Stay (HOSPITAL_COMMUNITY): Payer: 59 | Admitting: Anesthesiology

## 2013-11-18 DIAGNOSIS — O1410 Severe pre-eclampsia, unspecified trimester: Secondary | ICD-10-CM | POA: Diagnosis present

## 2013-11-18 DIAGNOSIS — O1414 Severe pre-eclampsia complicating childbirth: Secondary | ICD-10-CM | POA: Diagnosis not present

## 2013-11-18 DIAGNOSIS — R030 Elevated blood-pressure reading, without diagnosis of hypertension: Secondary | ICD-10-CM | POA: Diagnosis not present

## 2013-11-18 LAB — COMPREHENSIVE METABOLIC PANEL
ALBUMIN: 2 g/dL — AB (ref 3.5–5.2)
ALT: 42 U/L — AB (ref 0–35)
AST: 55 U/L — AB (ref 0–37)
Alkaline Phosphatase: 132 U/L — ABNORMAL HIGH (ref 39–117)
BILIRUBIN TOTAL: 0.4 mg/dL (ref 0.3–1.2)
BUN: 13 mg/dL (ref 6–23)
CHLORIDE: 103 meq/L (ref 96–112)
CO2: 22 meq/L (ref 19–32)
Calcium: 7.7 mg/dL — ABNORMAL LOW (ref 8.4–10.5)
Creatinine, Ser: 1.09 mg/dL (ref 0.50–1.10)
GFR calc Af Amer: 78 mL/min — ABNORMAL LOW (ref >90)
GFR, EST NON AFRICAN AMERICAN: 67 mL/min — AB (ref >90)
Glucose, Bld: 89 mg/dL (ref 70–99)
Potassium: 3.9 mEq/L (ref 3.7–5.3)
SODIUM: 135 meq/L — AB (ref 137–147)
Total Protein: 4.9 g/dL — ABNORMAL LOW (ref 6.0–8.3)

## 2013-11-18 LAB — CBC
HCT: 34.1 % — ABNORMAL LOW (ref 36.0–46.0)
Hemoglobin: 11.5 g/dL — ABNORMAL LOW (ref 12.0–15.0)
MCH: 27.7 pg (ref 26.0–34.0)
MCHC: 33.7 g/dL (ref 30.0–36.0)
MCV: 82.2 fL (ref 78.0–100.0)
PLATELETS: 160 10*3/uL (ref 150–400)
RBC: 4.15 MIL/uL (ref 3.87–5.11)
RDW: 16 % — ABNORMAL HIGH (ref 11.5–15.5)
WBC: 9.5 10*3/uL (ref 4.0–10.5)

## 2013-11-18 LAB — LACTATE DEHYDROGENASE: LDH: 424 U/L — AB (ref 94–250)

## 2013-11-18 LAB — URIC ACID: Uric Acid, Serum: 6.3 mg/dL (ref 2.4–7.0)

## 2013-11-18 SURGERY — Surgical Case
Anesthesia: Spinal | Site: Abdomen

## 2013-11-18 MED ORDER — SENNOSIDES-DOCUSATE SODIUM 8.6-50 MG PO TABS: 2.0000 | ORAL_TABLET | ORAL | Status: DC

## 2013-11-18 MED ORDER — DIPHENHYDRAMINE HCL 25 MG PO CAPS: 25.0000 mg | ORAL_CAPSULE | Freq: Four times a day (QID) | ORAL | Status: DC | PRN

## 2013-11-18 MED ORDER — WITCH HAZEL-GLYCERIN EX PADS: 1.0000 "application " | MEDICATED_PAD | CUTANEOUS | Status: DC | PRN

## 2013-11-18 MED ORDER — SCOPOLAMINE 1 MG/3DAYS TD PT72
1.0000 | MEDICATED_PATCH | Freq: Once | TRANSDERMAL | Status: DC
  Filled 2013-11-18: qty 1

## 2013-11-18 MED ORDER — SIMETHICONE 80 MG PO CHEW: 80.0000 mg | CHEWABLE_TABLET | ORAL | Status: DC | PRN

## 2013-11-18 MED ORDER — TETANUS-DIPHTH-ACELL PERTUSSIS 5-2.5-18.5 LF-MCG/0.5 IM SUSP: 0.5000 mL | Freq: Once | INTRAMUSCULAR | Status: DC

## 2013-11-18 MED ORDER — SIMETHICONE 80 MG PO CHEW: 80.0000 mg | CHEWABLE_TABLET | ORAL | Status: DC

## 2013-11-18 MED ORDER — ONDANSETRON HCL 4 MG/2ML IJ SOLN: 4.0000 mg | INTRAMUSCULAR | Status: DC | PRN

## 2013-11-18 MED ORDER — ONDANSETRON HCL 4 MG PO TABS: 4.0000 mg | ORAL_TABLET | ORAL | Status: DC | PRN

## 2013-11-18 MED ORDER — LACTATED RINGERS IV BOLUS (SEPSIS)
500.0000 mL | Freq: Once | INTRAVENOUS | Status: AC
  Administered 2013-11-18: 500 mL via INTRAVENOUS

## 2013-11-18 MED ORDER — ONDANSETRON HCL 4 MG/2ML IJ SOLN
INTRAMUSCULAR | Status: DC | PRN
Start: 2013-11-18 — End: 2013-11-18
  Administered 2013-11-18: 4 mg via INTRAVENOUS

## 2013-11-18 MED ORDER — PHENYLEPHRINE 8 MG IN D5W 100 ML (0.08MG/ML) PREMIX OPTIME
INJECTION | INTRAVENOUS | Status: AC
  Filled 2013-11-18: qty 100

## 2013-11-18 MED ORDER — TETANUS-DIPHTH-ACELL PERTUSSIS 5-2.5-18.5 LF-MCG/0.5 IM SUSP
0.5000 mL | Freq: Once | INTRAMUSCULAR | Status: AC
  Administered 2013-11-19: 0.5 mL via INTRAMUSCULAR
  Filled 2013-11-18 (×2): qty 0.5

## 2013-11-18 MED ORDER — FENTANYL CITRATE 0.05 MG/ML IJ SOLN
INTRAMUSCULAR | Status: AC
  Filled 2013-11-18: qty 2

## 2013-11-18 MED ORDER — LACTATED RINGERS IV SOLN: INTRAVENOUS | Status: DC

## 2013-11-18 MED ORDER — NALOXONE HCL 0.4 MG/ML IJ SOLN: 0.4000 mg | INTRAMUSCULAR | Status: DC | PRN

## 2013-11-18 MED ORDER — SIMETHICONE 80 MG PO CHEW: 80.0000 mg | CHEWABLE_TABLET | Freq: Three times a day (TID) | ORAL | Status: DC

## 2013-11-18 MED ORDER — DIPHENHYDRAMINE HCL 50 MG/ML IJ SOLN: 12.5000 mg | INTRAMUSCULAR | Status: DC | PRN

## 2013-11-18 MED ORDER — OXYTOCIN 10 UNIT/ML IJ SOLN
INTRAMUSCULAR | Status: AC
  Filled 2013-11-18: qty 4

## 2013-11-18 MED ORDER — NALBUPHINE HCL 10 MG/ML IJ SOLN: 5.0000 mg | INTRAMUSCULAR | Status: DC | PRN

## 2013-11-18 MED ORDER — IBUPROFEN 600 MG PO TABS: 600.0000 mg | ORAL_TABLET | Freq: Four times a day (QID) | ORAL | Status: DC

## 2013-11-18 MED ORDER — DIBUCAINE 1 % RE OINT: 1.0000 "application " | TOPICAL_OINTMENT | RECTAL | Status: DC | PRN

## 2013-11-18 MED ORDER — ONDANSETRON HCL 4 MG/2ML IJ SOLN
INTRAMUSCULAR | Status: AC
  Filled 2013-11-18: qty 2

## 2013-11-18 MED ORDER — OXYCODONE-ACETAMINOPHEN 5-325 MG PO TABS
1.0000 | ORAL_TABLET | ORAL | Status: DC | PRN
  Administered 2013-11-19 – 2013-11-20 (×3): 1 via ORAL
  Filled 2013-11-18 (×3): qty 1

## 2013-11-18 MED ORDER — DIPHENHYDRAMINE HCL 25 MG PO CAPS
25.0000 mg | ORAL_CAPSULE | ORAL | Status: DC | PRN
  Administered 2013-11-18: 25 mg via ORAL
  Filled 2013-11-18: qty 1

## 2013-11-18 MED ORDER — MAGNESIUM SULFATE 40 G IN LACTATED RINGERS - SIMPLE
2.0000 g/h | INTRAVENOUS | Status: DC
  Administered 2013-11-18: 2 g/h via INTRAVENOUS
  Filled 2013-11-18: qty 500

## 2013-11-18 MED ORDER — BUPIVACAINE IN DEXTROSE 0.75-8.25 % IT SOLN
INTRATHECAL | Status: DC | PRN
  Administered 2013-11-18: 1.8 mL via INTRATHECAL

## 2013-11-18 MED ORDER — FENTANYL CITRATE 0.05 MG/ML IJ SOLN: 25.0000 ug | INTRAMUSCULAR | Status: DC | PRN

## 2013-11-18 MED ORDER — LANOLIN HYDROUS EX OINT: 1.0000 "application " | TOPICAL_OINTMENT | CUTANEOUS | Status: DC | PRN

## 2013-11-18 MED ORDER — MENTHOL 3 MG MT LOZG: 1.0000 | LOZENGE | OROMUCOSAL | Status: DC | PRN

## 2013-11-18 MED ORDER — MORPHINE SULFATE (PF) 0.5 MG/ML IJ SOLN
INTRAMUSCULAR | Status: DC | PRN
  Administered 2013-11-18: .15 mg via INTRATHECAL

## 2013-11-18 MED ORDER — ZOLPIDEM TARTRATE 5 MG PO TABS: 5.0000 mg | ORAL_TABLET | Freq: Every evening | ORAL | Status: DC | PRN

## 2013-11-18 MED ORDER — ONDANSETRON HCL 4 MG/2ML IJ SOLN: 4.0000 mg | Freq: Three times a day (TID) | INTRAMUSCULAR | Status: DC | PRN

## 2013-11-18 MED ORDER — CEFAZOLIN SODIUM-DEXTROSE 2-3 GM-% IV SOLR
2.0000 g | Freq: Once | INTRAVENOUS | Status: AC
  Administered 2013-11-18: 2 g via INTRAVENOUS
  Filled 2013-11-18: qty 50

## 2013-11-18 MED ORDER — DIPHENHYDRAMINE HCL 50 MG/ML IJ SOLN: 25.0000 mg | INTRAMUSCULAR | Status: DC | PRN

## 2013-11-18 MED ORDER — MORPHINE SULFATE 0.5 MG/ML IJ SOLN
INTRAMUSCULAR | Status: AC
  Filled 2013-11-18: qty 10

## 2013-11-18 MED ORDER — CEFAZOLIN SODIUM-DEXTROSE 2-3 GM-% IV SOLR
INTRAVENOUS | Status: AC
  Filled 2013-11-18: qty 50

## 2013-11-18 MED ORDER — LACTATED RINGERS IV SOLN
INTRAVENOUS | Status: DC | PRN
  Administered 2013-11-18: 08:00:00 via INTRAVENOUS

## 2013-11-18 MED ORDER — LACTATED RINGERS IV SOLN
INTRAVENOUS | Status: DC | PRN
  Administered 2013-11-18: 09:00:00 via INTRAVENOUS

## 2013-11-18 MED ORDER — LACTATED RINGERS IV SOLN
INTRAVENOUS | Status: DC
  Administered 2013-11-18 – 2013-11-19 (×3): via INTRAVENOUS

## 2013-11-18 MED ORDER — OXYTOCIN 10 UNIT/ML IJ SOLN
40.0000 [IU] | INTRAVENOUS | Status: DC | PRN
  Administered 2013-11-18: 40 [IU] via INTRAVENOUS

## 2013-11-18 MED ORDER — METOCLOPRAMIDE HCL 5 MG/ML IJ SOLN: 10.0000 mg | Freq: Three times a day (TID) | INTRAMUSCULAR | Status: DC | PRN

## 2013-11-18 MED ORDER — CITRIC ACID-SODIUM CITRATE 334-500 MG/5ML PO SOLN
ORAL | Status: AC
  Administered 2013-11-18: 30 mL
  Filled 2013-11-18: qty 15

## 2013-11-18 MED ORDER — SIMETHICONE 80 MG PO CHEW
80.0000 mg | CHEWABLE_TABLET | Freq: Three times a day (TID) | ORAL | Status: DC
  Administered 2013-11-18 – 2013-11-21 (×11): 80 mg via ORAL
  Filled 2013-11-18 (×11): qty 1

## 2013-11-18 MED ORDER — OXYTOCIN 40 UNITS IN LACTATED RINGERS INFUSION - SIMPLE MED: 62.5000 mL/h | INTRAVENOUS | Status: DC

## 2013-11-18 MED ORDER — DEXTROSE 5 % IV SOLN
1.0000 ug/kg/h | INTRAVENOUS | Status: DC | PRN
  Filled 2013-11-18: qty 2

## 2013-11-18 MED ORDER — PRENATAL MULTIVITAMIN CH: 1.0000 | ORAL_TABLET | Freq: Every day | ORAL | Status: DC

## 2013-11-18 MED ORDER — SODIUM CHLORIDE 0.9 % IJ SOLN: 3.0000 mL | INTRAMUSCULAR | Status: DC | PRN

## 2013-11-18 MED ORDER — FENTANYL CITRATE 0.05 MG/ML IJ SOLN
INTRAMUSCULAR | Status: DC | PRN
  Administered 2013-11-18: 25 ug via INTRATHECAL

## 2013-11-18 MED ORDER — IBUPROFEN 600 MG PO TABS
600.0000 mg | ORAL_TABLET | Freq: Four times a day (QID) | ORAL | Status: DC
  Administered 2013-11-18 – 2013-11-21 (×12): 600 mg via ORAL
  Filled 2013-11-18 (×12): qty 1

## 2013-11-18 MED ORDER — OXYTOCIN 40 UNITS IN LACTATED RINGERS INFUSION - SIMPLE MED: 62.5000 mL/h | INTRAVENOUS | Status: AC

## 2013-11-18 MED ORDER — OXYCODONE-ACETAMINOPHEN 5-325 MG PO TABS: 1.0000 | ORAL_TABLET | ORAL | Status: DC | PRN

## 2013-11-18 MED ORDER — MEPERIDINE HCL 25 MG/ML IJ SOLN: 6.2500 mg | INTRAMUSCULAR | Status: DC | PRN

## 2013-11-18 SURGICAL SUPPLY — 37 items
ADH SKN CLS APL DERMABOND .7 (GAUZE/BANDAGES/DRESSINGS) ×1
CLAMP CORD UMBIL (MISCELLANEOUS) ×2 IMPLANT
CLOTH BEACON ORANGE TIMEOUT ST (SAFETY) ×3 IMPLANT
DERMABOND ADVANCED (GAUZE/BANDAGES/DRESSINGS) ×2
DERMABOND ADVANCED .7 DNX12 (GAUZE/BANDAGES/DRESSINGS) IMPLANT
DRAPE LG THREE QUARTER DISP (DRAPES) ×2 IMPLANT
DRSG OPSITE POSTOP 4X10 (GAUZE/BANDAGES/DRESSINGS) ×3 IMPLANT
DURAPREP 26ML APPLICATOR (WOUND CARE) ×3 IMPLANT
ELECT REM PT RETURN 9FT ADLT (ELECTROSURGICAL) ×3
ELECTRODE REM PT RTRN 9FT ADLT (ELECTROSURGICAL) ×1 IMPLANT
GLOVE BIO SURGEON STRL SZ 6 (GLOVE) ×3 IMPLANT
GLOVE BIO SURGEON STRL SZ 6.5 (GLOVE) ×1 IMPLANT
GLOVE BIO SURGEONS STRL SZ 6.5 (GLOVE) ×1
GLOVE BIOGEL PI IND STRL 6 (GLOVE) ×2 IMPLANT
GLOVE BIOGEL PI IND STRL 6.5 (GLOVE) IMPLANT
GLOVE BIOGEL PI IND STRL 7.0 (GLOVE) IMPLANT
GLOVE BIOGEL PI INDICATOR 6 (GLOVE) ×4
GLOVE BIOGEL PI INDICATOR 6.5 (GLOVE) ×8
GLOVE BIOGEL PI INDICATOR 7.0 (GLOVE) ×8
GLOVE ECLIPSE 6.0 STRL STRAW (GLOVE) ×8 IMPLANT
GLOVE SURG SS PI 7.0 STRL IVOR (GLOVE) ×12 IMPLANT
GOWN STRL REUS W/TWL LRG LVL3 (GOWN DISPOSABLE) ×6 IMPLANT
KIT ABG SYR 3ML LUER SLIP (SYRINGE) ×3 IMPLANT
NDL HYPO 25X5/8 SAFETYGLIDE (NEEDLE) ×1 IMPLANT
NEEDLE HYPO 25X5/8 SAFETYGLIDE (NEEDLE) ×3 IMPLANT
NS IRRIG 1000ML POUR BTL (IV SOLUTION) ×3 IMPLANT
PACK C SECTION WH (CUSTOM PROCEDURE TRAY) ×3 IMPLANT
PAD OB MATERNITY 4.3X12.25 (PERSONAL CARE ITEMS) ×3 IMPLANT
RETAINER VISCERAL (MISCELLANEOUS) ×2 IMPLANT
SUT CHROMIC 0 CTX 36 (SUTURE) ×9 IMPLANT
SUT MON AB 2-0 CT1 27 (SUTURE) ×3 IMPLANT
SUT PDS AB 0 CT1 27 (SUTURE) ×2 IMPLANT
SUT VIC AB 0 CT1 36 (SUTURE) ×4 IMPLANT
SUT VIC AB 4-0 KS 27 (SUTURE) ×2 IMPLANT
TOWEL OR 17X24 6PK STRL BLUE (TOWEL DISPOSABLE) ×3 IMPLANT
TRAY FOLEY CATH 14FR (SET/KITS/TRAYS/PACK) ×2 IMPLANT
WATER STERILE IRR 1000ML POUR (IV SOLUTION) ×3 IMPLANT

## 2013-11-18 NOTE — Anesthesia Preprocedure Evaluation (Signed)
Anesthesia Evaluation  Patient identified by MRN, date of birth, ID band Patient awake    Reviewed: Allergy & Precautions, H&P , NPO status , Patient's Chart, lab work & pertinent test results  Airway Mallampati: III TM Distance: >3 FB Neck ROM: Full    Dental no notable dental hx. (+) Teeth Intact   Pulmonary neg pulmonary ROS,  breath sounds clear to auscultation  Pulmonary exam normal       Cardiovascular hypertension, Pt. on medications Rhythm:Regular Rate:Normal     Neuro/Psych negative neurological ROS  negative psych ROS   GI/Hepatic negative GI ROS, Neg liver ROS,   Endo/Other  Morbid obesity  Renal/GU negative Renal ROS  negative genitourinary   Musculoskeletal negative musculoskeletal ROS (+)   Abdominal (+) + obese,   Peds  Hematology  (+) anemia ,   Anesthesia Other Findings   Reproductive/Obstetrics (+) Pregnancy PIH Pre ecclampsia                           Anesthesia Physical Anesthesia Plan  ASA: III and emergent  Anesthesia Plan:    Post-op Pain Management:    Induction: Intravenous  Airway Management Planned: Natural Airway  Additional Equipment:   Intra-op Plan:   Post-operative Plan:   Informed Consent: I have reviewed the patients History and Physical, chart, labs and discussed the procedure including the risks, benefits and alternatives for the proposed anesthesia with the patient or authorized representative who has indicated his/her understanding and acceptance.   Dental advisory given  Plan Discussed with: Anesthesiologist, CRNA and Surgeon  Anesthesia Plan Comments:         Anesthesia Quick Evaluation

## 2013-11-18 NOTE — Preoperative (Signed)
Beta Blockers   Reason not to administer Beta Blockers:Not Applicable 

## 2013-11-18 NOTE — Anesthesia Postprocedure Evaluation (Signed)
  Anesthesia Post-op Note  Patient: Sarah Ayala  Procedure(s) Performed: Procedure(s): CESAREAN SECTION (N/A)  Patient Location: PACU  Anesthesia Type:Spinal  Level of Consciousness: awake, alert  and oriented  Airway and Oxygen Therapy: Patient Spontanous Breathing  Post-op Pain: none  Post-op Assessment: Post-op Vital signs reviewed, Patient's Cardiovascular Status Stable, Respiratory Function Stable, Patent Airway, No signs of Nausea or vomiting, Pain level controlled, No headache and No backache  Post-op Vital Signs: Reviewed and stable  Complications: No apparent anesthesia complications

## 2013-11-18 NOTE — Progress Notes (Signed)
UR chart review completed.  

## 2013-11-18 NOTE — Addendum Note (Signed)
Addendum created 11/18/13 1421 by Renford DillsJanet L , CRNA   Modules edited: Notes Section   Notes Section:  File: [id number]

## 2013-11-18 NOTE — Progress Notes (Signed)
FHT with decreased variability this morning.  BPP 4/10.  HELLP labs with elevated Cr (1.09) and increasing AST/ALT (55/42).  Absent EDF on yesterday's Doppler's.    Patient and husband are counseled on the above and recommendation for delivery.  Informed of risk of bleeding possibly requiring transfusion or life-saving C-hyst.  Additionally informed of risk of damage to surrounding structures like the bowel, bladder and fetus.  She is questioned about desire for BTL and she is undecided.  She understands the risk of abnormal placentation in future pregnancies.  All questions were answered and the pt is ready to proceed.    Dr. Eric FormWimmer of NICU consulted to counsel patient and husband.

## 2013-11-18 NOTE — Anesthesia Postprocedure Evaluation (Signed)
  Anesthesia Post-op Note  Patient: Sarah Ayala  Procedure(s) Performed: Procedure(s): CESAREAN SECTION (N/A)  Patient Location: ICU  Anesthesia Type:Spinal  Level of Consciousness: awake  Airway and Oxygen Therapy: Patient Spontanous Breathing  Post-op Pain: mild  Post-op Assessment: Patient's Cardiovascular Status Stable and Respiratory Function Stable  Post-op Vital Signs: stable  Complications: No apparent anesthesia complications

## 2013-11-18 NOTE — Anesthesia Procedure Notes (Signed)
Spinal  Patient location during procedure: OR Start time: 11/18/2013 8:41 AM Staffing Anesthesiologist: ,  A. Performed by: anesthesiologist  Preanesthetic Checklist Completed: patient identified, site marked, surgical consent, pre-op evaluation, timeout performed, IV checked, risks and benefits discussed and monitors and equipment checked Spinal Block Patient position: sitting Prep: site prepped and draped and DuraPrep Patient monitoring: heart rate, cardiac monitor, continuous pulse ox and blood pressure Approach: midline Location: L3-4 Injection technique: single-shot Needle Needle type: Sprotte  Needle gauge: 24 G Needle length: 9 cm Needle insertion depth: 6 cm Assessment Sensory level: T3 Additional Notes Patient tolerated procedure well. Adequate sensory level.

## 2013-11-18 NOTE — OR Nursing (Signed)
Dr. Vincente PoliGrewal primary physician for c-section Dr. Langston MaskerMorris assisting.

## 2013-11-18 NOTE — Brief Op Note (Signed)
11/17/2013 - 11/18/2013  9:29 AM  PATIENT:  Sarah Ayala  33 y.o. female  PRE-OPERATIVE DIAGNOSIS:  IUP at 27 weeks and 3 days, Previous C Section x 2, Severe Preeclampsia  POST-OPERATIVE DIAGNOSIS: Same  PROCEDURE:  Procedure(s): CESAREAN SECTION (N/A) Repeat LTCS  SURGEON:  Surgeon(s) and Role:    * Mitchel HonourMegan Morris, DO - Primary    * Jeani HawkingMichelle L , MD - Assisting  PHYSICIAN ASSISTANT:   ASSISTANTS: none   ANESTHESIA:   spinal  EBL:  Total I/O In: -  Out: 500 [Blood:500]  BLOOD ADMINISTERED:none  DRAINS: Urinary Catheter (Foley)   LOCAL MEDICATIONS USED:  NONE  SPECIMEN:  Source of Specimen:  placenta  DISPOSITION OF SPECIMEN:  PATHOLOGY  COUNTS:  YES  TOURNIQUET:  * No tourniquets in log *  DICTATION: .Other Dictation: Dictation Number [id number]  PLAN OF CARE: Admit to inpatient   PATIENT DISPOSITION:  ICU - extubated and stable.   Delay start of Pharmacological VTE agent (>24hrs) due to surgical blood loss or risk of bleeding: not applicable

## 2013-11-18 NOTE — Progress Notes (Signed)
11/18/13 1500  Clinical Encounter Type  Visited With Patient and family together (husband Sarah Ayala)  Visit Type Initial;Spiritual support;Social support  Referral From Nurse Raquel Sarna(Lorinda Shaw, RN, house coverage)  Spiritual Encounters  Spiritual Needs Emotional   Sarah LeydenKanecia and husband Sarah Ayala were in good spirits despite the suddenness of delivery.  Sarah Ayala was very pleased with MD/RN communication throughout the discernment leading to the c-section and through the procedure/recovery; he cites this as a big help in his coping.  Provided intro to Spiritual Care and chaplain availability, as well as support in discussing self-care and coping tools.  Past experience with premature deliveries and NICU care is helping to reduce anxiety and increase comfort level through their experience this time.  [name] will follow for support, but please also page as needs arise:  [phone].  Thank you.  [address]Chaplain  [place], [place] [phone]

## 2013-11-18 NOTE — Transfer of Care (Signed)
Immediate Anesthesia Transfer of Care Note  Patient: Delbert HarnessKanecia L Rogers  Procedure(s) Performed: Procedure(s): CESAREAN SECTION (N/A)  Patient Location: PACU  Anesthesia Type:Spinal  Level of Consciousness: awake, alert  and oriented  Airway & Oxygen Therapy: Patient Spontanous Breathing  Post-op Assessment: Report given to PACU RN and Post -op Vital signs reviewed and stable  Post vital signs: Reviewed and stable  Complications: No apparent anesthesia complications

## 2013-11-18 NOTE — Lactation Note (Addendum)
This note was copied from the chart of Sarah Ayala. Lactation Consultation Note     Initial consult with this mom of a NICU baby, 4327 3/[redacted] weeks gestation, born by C-section due to mom having severe pre-eclampsia, and now 4 hours post partum. Mom wants to provide EBM for her baby, so I started her pumping with DEP, and did teaching from the NICU booklet. I showed both mom and dad how to hand express mom's breast, and she was able to express a few small drops of colostrum. Mom was squirting from the left breast. This made mom and dad pleased. Dad to bring the colostrum to the baby. Hand expression difficult for this mom, due to very large breasts with lots of dense tissue. Dad agrees to help mom with pumping and hand expression. Mom told to rest and pump/hand expression for today, as much as she is able. Skin to skin with baby encouraged, as soon as she is able to do so. I will follow this family in the NICU.    Patient Name: Sarah Ayala WUJWJ'XToday's Date: 11/18/2013 Reason for consult: Initial assessment;NICU baby;Infant < 6lbs   Maternal Data Formula Feeding for Exclusion: Yes (baby in NICU and mom in AICU) Infant to breast within first hour of birth: No Breastfeeding delayed due to:: Infant status Has patient been taught Hand Expression?: Yes Does the patient have breastfeeding experience prior to this delivery?: Yes  Feeding    LATCH Score/Interventions                      Lactation Tools Discussed/Used Tools: Pump;Flanges Flange Size: Other (comment) (size 21) WIC Program: No Pump Review: Setup, frequency, and cleaning;Milk Storage;Other (comment) (premie setting, hand expression, review of NICU booklet on EBM) Initiated by:: c  rn at 4 hours post partum Date initiated:: 11/18/13   Consult Status Consult Status: Follow-up Date: 11/19/13 Follow-up type: In-patient    Alfred LevinsLee,  Anne 11/18/2013, 3:02 PM

## 2013-11-18 NOTE — Consult Note (Signed)
Asked by Dr. Langston MaskerMorris to provide prenatal consultation for  33 y.o. G3 P2 who is now 8227 3/[redacted] weeks EGA, with pregnancy complicated by preeclampsia.  She was given betamethasone last week.  She had 2 preterm infants previously, one of which was a twin whose co-twin was an IUFD.  Talked with patient and FOB about expectations for preterm infant at 3527 - [redacted] wks EGA.  Their last child was [redacted] weeks gestation, treated in NICU here for 2 months then transferred to [facility] for intestinal problems (lack of stooling, according to parents). Discussed possible needs for DR resuscitation, respiratory support, IV access, and blood products.  Also presented risks of death or serious morbidity, including lung, brain, eye, and intestinal complications.  Projected possible length of stay in NICU until 36 - [redacted] wks EGA.  Discussed advantages of feeding with mother's milk, she intends to begin pumping postnatally.  Patient and FOB were attentive and appreciative of my input.  Thank you for the consultation.  Total time 20 minutes, face-to-face time 15 minutes

## 2013-11-19 ENCOUNTER — Encounter (HOSPITAL_COMMUNITY): Payer: Self-pay | Admitting: Obstetrics & Gynecology

## 2013-11-19 LAB — COMPREHENSIVE METABOLIC PANEL
ALK PHOS: 121 U/L — AB (ref 39–117)
ALT: 29 U/L (ref 0–35)
AST: 40 U/L — AB (ref 0–37)
Albumin: 1.9 g/dL — ABNORMAL LOW (ref 3.5–5.2)
BUN: 14 mg/dL (ref 6–23)
CO2: 24 mEq/L (ref 19–32)
Calcium: 6.8 mg/dL — ABNORMAL LOW (ref 8.4–10.5)
Chloride: 101 mEq/L (ref 96–112)
Creatinine, Ser: 1.1 mg/dL (ref 0.50–1.10)
GFR calc Af Amer: 77 mL/min — ABNORMAL LOW (ref >90)
GFR calc non Af Amer: 66 mL/min — ABNORMAL LOW (ref >90)
Glucose, Bld: 80 mg/dL (ref 70–99)
POTASSIUM: 4.1 meq/L (ref 3.7–5.3)
SODIUM: 136 meq/L — AB (ref 137–147)
TOTAL PROTEIN: 5.3 g/dL — AB (ref 6.0–8.3)
Total Bilirubin: 0.4 mg/dL (ref 0.3–1.2)

## 2013-11-19 LAB — CBC
HCT: 32.1 % — ABNORMAL LOW (ref 36.0–46.0)
HEMOGLOBIN: 10.7 g/dL — AB (ref 12.0–15.0)
MCH: 27.6 pg (ref 26.0–34.0)
MCHC: 33.3 g/dL (ref 30.0–36.0)
MCV: 82.9 fL (ref 78.0–100.0)
PLATELETS: 124 10*3/uL — AB (ref 150–400)
RBC: 3.87 MIL/uL (ref 3.87–5.11)
RDW: 16.4 % — ABNORMAL HIGH (ref 11.5–15.5)
WBC: 12.5 10*3/uL — ABNORMAL HIGH (ref 4.0–10.5)

## 2013-11-19 LAB — URIC ACID: Uric Acid, Serum: 6.1 mg/dL (ref 2.4–7.0)

## 2013-11-19 MED ORDER — SODIUM CHLORIDE 0.9 % IJ SOLN: 3.0000 mL | Freq: Two times a day (BID) | INTRAMUSCULAR | Status: DC

## 2013-11-19 MED ORDER — LABETALOL HCL 100 MG PO TABS
100.0000 mg | ORAL_TABLET | Freq: Two times a day (BID) | ORAL | Status: DC
  Administered 2013-11-19 – 2013-11-21 (×5): 100 mg via ORAL
  Filled 2013-11-19 (×5): qty 1

## 2013-11-19 MED ORDER — SODIUM CHLORIDE 0.9 % IJ SOLN
3.0000 mL | Freq: Two times a day (BID) | INTRAMUSCULAR | Status: DC
  Administered 2013-11-19: 3 mL via INTRAVENOUS

## 2013-11-19 MED ORDER — SODIUM CHLORIDE 0.9 % IJ SOLN: 3.0000 mL | INTRAMUSCULAR | Status: DC | PRN

## 2013-11-19 NOTE — Lactation Note (Signed)
This note was copied from the chart of Sarah Ayala. Lactation Consultation Note     Follow up consult with this mom of a NICU baby, now 26 hours post partum, and 274/7 weeks corrected gestation. Mom is still in AICU on magnesiuim drip. She has not pumped snce yesterdady, and refused my offer to hand express come for her.  Mom knows to have lactation called for questions/concerns.  Patient Name: Sarah Ayala JYNWG'NToday's Date: 11/19/2013 Reason for consult: Follow-up assessment;NICU baby   Maternal Data    Feeding    LATCH Score/Interventions                      Lactation Tools Discussed/Used     Consult Status Consult Status: Follow-up Date: 11/20/13 Follow-up type: In-patient    Alfred LevinsLee,  Anne 11/19/2013, 2:46 PM

## 2013-11-19 NOTE — Op Note (Signed)
NAMEJORDYNN, Sarah Ayala              ACCOUNT NO.:  [id number]  MEDICAL RECORD NO.:  [id number]  LOCATION:  9373                          FACILITY:  [facility]  PHYSICIAN:   L. , M.D.DATE OF BIRTH:  1981-02-05  DATE OF PROCEDURE:  11/18/2013 DATE OF DISCHARGE:                              OPERATIVE REPORT   PREOPERATIVE DIAGNOSES:  Intrauterine pregnancy at 27 weeks and 3 days, severe preeclampsia, intrauterine growth restriction, and previous cesarean section x2.  POSTOPERATIVE DIAGNOSES:  Intrauterine pregnancy at 27 weeks and 3 days, severe preeclampsia, intrauterine growth restriction, and previous cesarean section x2.  PROCEDURE:  Repeat low transverse cesarean section.  SURGEON:   L. Vincente Poli, M.D.  ASSISTANT:  Dr. Mitchel Honour.  ANESTHESIA:  Spinal.  EBL:  300.  COMPLICATIONS:  None.  DRAINS:  Foley catheter.  PATHOLOGY:  Placenta sent to Pathology.  DESCRIPTION OF PROCEDURE:  The patient had been counseled by Dr. Langston Masker and the neonatologist and __________ explained about the risk associated with the C-section.  She had been offered a tubal ligation, but had declined.  The patient was taken to the operating room, a spinal was placed.  She was prepped and draped.  A Foley catheter was inserted.  A time-out was performed, a low transverse incision was made, carried down to the fascia.  Fascia was scored in the midline and extended laterally. Rectus muscles were separated in the midline.  The peritoneum was entered bluntly.  The peritoneal incision was then stretched.  The lower uterine segment was identified.  The bladder flap was created sharply and then digitally.  The bladder blade was then readjusted.  A low- transverse incision was made in the uterus.  The baby was in cephalic presentation.  Amniotomy revealed clear fluid.  The baby was delivered. There was a loose nuchal cord.  The baby was a premature infant, of course, appeared to be small for  gestational age and was immediately taken to the NICU.  The neonatologists were in the operating room and intubated the baby.  The baby was a female infant.  The cord was clamped and cut.  The baby was handed to the awaiting and was taken to the NICU while we finished the surgery.  The placenta was manually removed, noted to be normal, intact with a three-vessel cord.  It was sent to Pathology after cord blood and cord pH were obtained.  The uterus was exteriorized.  It was cleared of all clots and debris, and uterus was immediately firm.  The uterine incision was closed using 0 chromic in a running, locked stitch x2.  After the double-layer closure, the uterus was returned to the abdomen.  Peritoneum was closed using 0 Vicryl.  The fascia was closed using 0 Vicryl in a running stitch x2 starting each corner meeting in the midline.  After irrigation of subcutaneous layer, the skin was closed with a 4-0 Vicryl on a Keith needle.  Dermabond was applied.  All sponge, lap, and instrument counts were correct x2.  The patient went to recovery room in stable condition.      L. Vincente Poli, M.D.     Florestine Avers  D:  11/18/2013  T:  11/18/2013  Job:  [id number]

## 2013-11-19 NOTE — Discharge Summary (Signed)
Obstetric Discharge Summary Reason for Admission: evaluation at 26 4/7 weeks for preeclampsia and IUGR. Patient has known alloimmunization Anti-E and poor obstetric history. Previous c/s delivery   Prenatal Procedures: NST and ultrasound Intrapartum Procedures: na Postpartum Procedures: na Complications-Operative and Postpartum: na Hemoglobin  Date Value Ref Range Status  11/19/2013 10.7* 12.0 - 15.0 g/dL Final     HCT  Date Value Ref Range Status  11/19/2013 32.1* 36.0 - 46.0 % Final    Physical Exam:  General: alert and patient desires to leave hospital against MD recommendations Lochia: na Uterine Fundus: gravid Incision: na DVT Evaluation: No evidence of DVT seen on physical exam. Negative Homan's sign. No cords or calf tenderness. No significant calf/ankle edema.  Discharge Diagnoses: IUP at 27 weeks , IUGR, and concern for fetal well being, H/o poor obstretical outcome, Known alloimmunization anti -e  Discharge Information: Date: 11/19/2013 Activity: pelvic rest and bed rest Diet: routine Medications: PNV, and baby ASA Condition: stable, concern for fetal well- being Instruction: advised against leaving hospital . Patient agreed to RTO on Monday for further fetal evaluation Discharge to: patient left hospital AMA   Newborn Data:   NA  , G 11/19/2013, 9:33 AM

## 2013-11-19 NOTE — Progress Notes (Signed)
7/10 incisional pain at this time, will medicate

## 2013-11-19 NOTE — Progress Notes (Signed)
Patient ID: Sarah Ayala, female   DOB: Dec 15, 1980, 33 y.o.   MRN: [id number] BPs stable UOP good Pt ambulating well Will Dc Mag and transfer to floor with labs in am DL

## 2013-11-19 NOTE — Progress Notes (Signed)
Awaiting transfer to floor ([facility])

## 2013-11-19 NOTE — Progress Notes (Signed)
Subjective: Postpartum Day 1: Cesarean Delivery Patient reports tolerating PO.  Pain is minimal.  Baby doing well in NICU on nasal canula (27 weeks)  Objective: Vital signs in last 24 hours: Temp:  [97.6 F (36.4 C)-98.6 F (37 C)] 98.6 F (37 C) (02/18 0801) Pulse Rate:  [67-80] 75 (02/18 0900) Resp:  [15-18] 18 (02/18 0941) BP: (123-173)/(76-105) 143/102 mmHg (02/18 0900) SpO2:  [95 %-100 %] 100 % (02/18 0900) Weight:  [116.983 kg (257 lb 14.4 oz)] 116.983 kg (257 lb 14.4 oz) (02/18 0500)  Physical Exam:  General: alert, cooperative, appears older than stated age and no distress Lochia: appropriate Uterine Fundus: firm Incision: healing well DVT Evaluation: No evidence of DVT seen on physical exam.   Recent Labs  11/18/13 0607 11/19/13 0505  HGB 11.5* 10.7*  HCT 34.1* 32.1*    Assessment/Plan: Status post Cesarean section. Postoperative course complicated by severe pre eclampsia  Labs still elevated SGOT but improved.  Diuresis beginning and BPs still elevated started on labetolol 200mg  BID this am Continue Mag today.  Recheck labs in am.  , C 11/19/2013, 9:42 AM

## 2013-11-20 LAB — CBC
HEMATOCRIT: 27.5 % — AB (ref 36.0–46.0)
HEMOGLOBIN: 9.2 g/dL — AB (ref 12.0–15.0)
MCH: 28 pg (ref 26.0–34.0)
MCHC: 33.5 g/dL (ref 30.0–36.0)
MCV: 83.8 fL (ref 78.0–100.0)
Platelets: 113 10*3/uL — ABNORMAL LOW (ref 150–400)
RBC: 3.28 MIL/uL — ABNORMAL LOW (ref 3.87–5.11)
RDW: 16.7 % — ABNORMAL HIGH (ref 11.5–15.5)
WBC: 12.7 10*3/uL — ABNORMAL HIGH (ref 4.0–10.5)

## 2013-11-20 LAB — COMPREHENSIVE METABOLIC PANEL
ALK PHOS: 100 U/L (ref 39–117)
ALT: 20 U/L (ref 0–35)
AST: 29 U/L (ref 0–37)
Albumin: 1.8 g/dL — ABNORMAL LOW (ref 3.5–5.2)
BUN: 13 mg/dL (ref 6–23)
CALCIUM: 6.4 mg/dL — AB (ref 8.4–10.5)
CO2: 25 meq/L (ref 19–32)
Chloride: 102 mEq/L (ref 96–112)
Creatinine, Ser: 1.16 mg/dL — ABNORMAL HIGH (ref 0.50–1.10)
GFR, EST AFRICAN AMERICAN: 72 mL/min — AB (ref >90)
GFR, EST NON AFRICAN AMERICAN: 62 mL/min — AB (ref >90)
GLUCOSE: 89 mg/dL (ref 70–99)
POTASSIUM: 4 meq/L (ref 3.7–5.3)
Sodium: 136 mEq/L — ABNORMAL LOW (ref 137–147)
Total Bilirubin: 0.2 mg/dL — ABNORMAL LOW (ref 0.3–1.2)
Total Protein: 5 g/dL — ABNORMAL LOW (ref 6.0–8.3)

## 2013-11-20 LAB — RPR: RPR Ser Ql: NONREACTIVE

## 2013-11-20 NOTE — Lactation Note (Signed)
This note was copied from the chart of Sarah Ayala. Lactation Consultation Note      Follow up[ consult with this mom of a NICU baby, now 948 hours old, and 27 5/7 weeks corrected gestation. Mom did pump once last evening, and expressed drops of colostrum. I reivewed with mom and dad how to hand express. Mom is expressing tiny drops of colostrum. She is now off magnesium drip, and out of AICU. I advised that she stay hydrated, and pump every 3 hours , around the clock, to protect her milk supply. Mom will need to rent a DEP on discharge, and I gave her the paper work to fill out for a pump rental. Mom knows to call for lactation for questions/concerns.  Patient Name: Sarah Ayala ZOXWR'UToday's Date: 11/20/2013 Reason for consult: Follow-up assessment   Maternal Data    Feeding    LATCH Score/Interventions                      Lactation Tools Discussed/Used Pump Review: Setup, frequency, and cleaning   Consult Status Consult Status: Follow-up Date: 11/21/13 Follow-up type: In-patient    Alfred LevinsLee,  Anne 11/20/2013, 10:02 AM

## 2013-11-20 NOTE — Progress Notes (Signed)
Subjective: Postpartum Day 3: Cesarean Delivery Patient reports tolerating PO, + flatus and no problems voiding baby stable in NICU on C-PAP.    Objective: Vital signs in last 24 hours: Temp:  [98 F (36.7 C)-98.6 F (37 C)] 98.1 F (36.7 C) (02/19 0527) Pulse Rate:  [74-94] 74 (02/19 0527) Resp:  [16-18] 16 (02/19 0527) BP: (125-157)/(86-108) 131/91 mmHg (02/19 0527) SpO2:  [94 %-100 %] 100 % (02/19 0527) Weight:  [260 lb 12 oz (118.275 kg)] 260 lb 12 oz (118.275 kg) (02/19 0527)  Physical Exam:  General: alert and cooperative Lochia: appropriate Uterine Fundus: firm Incision: healing well DVT Evaluation: No evidence of DVT seen on physical exam. Negative Homan's sign. No cords or calf tenderness. No significant calf/ankle edema.   Recent Labs  11/19/13 0505 11/20/13 0535  HGB 10.7* 9.2*  HCT 32.1* 27.5*    Assessment/Plan: Status post Cesarean section. Doing well postoperatively.  Continue current care.  , G 11/20/2013, 9:05 AM

## 2013-11-20 NOTE — Progress Notes (Signed)
Clinical Social Work Department PSYCHOSOCIAL ASSESSMENT - MATERNAL/CHILD 11/20/2013  Patient:  Sarah Ayala, Sarah Ayala  Account Number:  [id number]  Admit Date:  11/17/2013  Ardine Eng Name:   Not yet named    Clinical Social Worker:  Terri Piedra, [place]   Date/Time:  11/20/2013 12:00 N  Date Referred:        Other referral source:   No referral-NICU admission    I:  FAMILY / [place] legal guardian:  PARENT  Guardian - Name Guardian - Age Guardian - Address  Marybella Ethier [address] [place], [place], [place] [address]  Irma Newness  same   Other household support members/support persons Name Relationship DOB   BROTHER 2002   [place] 2007   Other support:   Parents report that they have a great support system.  Both sides of the family live in [place] and are supportive.    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 Burt Knack   IV  RISK FACTORS AND CURRENT PROBLEMS Current Problem:  None   Risk Factor & Current Problem Patient Issue Family Issue Risk Factor / Current Problem Comment   N N     V  SOCIAL WORK ASSESSMENT  CSW met with parents in MOB's third floor room to introduce myself, offer support and complete assessment due to NICU admission of 27 weeker.  Parents were very pleasant and welcoming of CSW's visit.  They report doing well and having had this  experience twice before.  They have a 38 year old son who was born at 34 weeks and spent 2 months in the NICU and a 32 year old daughter who was born at 41 weeks and spent 3 months in the NICU.  FOB states they know that this experience is a roller coaster, with lots of ups and downs, but they have a great support system and know that the staff in our NICU is great.  They both seem very positive at this point and have a good understanding of baby's medical situation and hospitalization ahead of him.  MOB states pre-eclampsia made her deliver her other children prematurely, and although she was hopeful it would not happen with this pregnancy, she knew it was a possibility.  She denies PPD after her other children and states no concerns with her emotions at this time.  Parents were very receptive to review of PPD signs and symptoms and agree to talk with CSW or MOB's doctor if they have emotional concerns at any time.  They seemed very appreciative of CSW's concern for their emotional wellbeing.  CSW explained ongoing support services offered by NICU CSW and gave contact information.  Parents do not have preparations made for baby at home at this point, but think they have adequate resources to do so prior to baby's discharge.  They report no issues with transportation after MOB's discharge  in order to visit baby.  FOB states he has 2 weeks of leave from his job and plans to take 1 week now and save 1 week for when baby goes home.  MOB states she has 12 weeks of FMLA and then has short term disability if needed.  She thinks she may return to work while baby is in the NICU and save some of her time off for baby's discharge.  CSW informed parents of baby's eligibility for SSI and assisted them in completing application.  Parents were relieved to know of this resource and thanked CSW for the information and for the assistance in completing the application.  CSW will submit once baby is named and Mother's Verification of  Facts is obtained.  Parents thanked CSW for the visit and state no questions, concerns or needs at this time.  CSW does not have any social concerns at this time and is available for continued support and assistance as needed/desired.   VI SOCIAL WORK PLAN Social Work Plan  Psychosocial Support/Ongoing Assessment of Needs  Patient/Family Education   Type of pt/family education:   Ongoing support offered by NICU CSW  PPD signs and symptoms   If child protective services report - county:   If child protective services report - date:   Information/referral to community resources comment:   No referral needs noted at this time.  Baby will be referred to [place], Early Intervention and [place].   Other social work plan:

## 2013-11-20 NOTE — Progress Notes (Signed)
CRITICAL VALUE ALERT  Critical value received: Ca+ 6.4  Date of notification:  11/20/13  Time of notification:  0615  Critical value read back:yes  Nurse who received alert: Aron Baba.   MD notified (1st page): Rana SnareLowe  Time of first page:  0615  Responding MD:  Rana SnareLowe  No new orders at this time.

## 2013-11-20 NOTE — Lactation Note (Signed)
This note was copied from the chart of Sarah Ayala. Lactation Consultation Note called to [facility] at 56 hours, baby is in NICU (2121w3d).  Mom is reporting that she only gets drops when she pumps, she admits today has been the first day she has been regular with pumping every 3 hours.  Encouraged mom to keep pumping to stimulate milk supply.  Encouraged hand expression after pumping to express colostrum to save for baby in NICU.  Reported to [facility] Rn to continue supporting mom's efforts with pumping.  LC working in NICU to follow up tomorrow for discharge.    Patient Name: Sarah Ayala GUYQI'HToday's Date: 11/20/2013     Maternal Data    Feeding    LATCH Score/Interventions                      Lactation Tools Discussed/Used     Consult Status      , Sarah Ayala 11/20/2013, 5:47 PM

## 2013-11-21 ENCOUNTER — Encounter (HOSPITAL_COMMUNITY)
Admission: RE | Admit: 2013-11-21 | Discharge: 2013-11-21 | Disposition: A | Discharge status: Home / Self Care | Payer: 59 | Source: Ambulatory Visit | Attending: Obstetrics and Gynecology | Admitting: Obstetrics and Gynecology

## 2013-11-21 DIAGNOSIS — O923 Agalactia: Secondary | ICD-10-CM | POA: Insufficient documentation

## 2013-11-21 LAB — COMPREHENSIVE METABOLIC PANEL
ALBUMIN: 1.8 g/dL — AB (ref 3.5–5.2)
ALT: 31 U/L (ref 0–35)
AST: 45 U/L — AB (ref 0–37)
Alkaline Phosphatase: 95 U/L (ref 39–117)
BILIRUBIN TOTAL: 0.3 mg/dL (ref 0.3–1.2)
BUN: 12 mg/dL (ref 6–23)
CO2: 24 mEq/L (ref 19–32)
CREATININE: 1.05 mg/dL (ref 0.50–1.10)
Calcium: 6.9 mg/dL — ABNORMAL LOW (ref 8.4–10.5)
Chloride: 103 mEq/L (ref 96–112)
GFR calc Af Amer: 81 mL/min — ABNORMAL LOW (ref >90)
GFR calc non Af Amer: 70 mL/min — ABNORMAL LOW (ref >90)
Glucose, Bld: 82 mg/dL (ref 70–99)
Potassium: 4.5 mEq/L (ref 3.7–5.3)
Sodium: 136 mEq/L — ABNORMAL LOW (ref 137–147)
TOTAL PROTEIN: 4.6 g/dL — AB (ref 6.0–8.3)

## 2013-11-21 LAB — TYPE AND SCREEN
ABO/RH(D): B POS
ANTIBODY SCREEN: POSITIVE
DAT, IgG: NEGATIVE
Unit division: 0
Unit division: 0

## 2013-11-21 LAB — CBC
HEMATOCRIT: 25.9 % — AB (ref 36.0–46.0)
HEMOGLOBIN: 8.5 g/dL — AB (ref 12.0–15.0)
MCH: 28 pg (ref 26.0–34.0)
MCHC: 32.8 g/dL (ref 30.0–36.0)
MCV: 85.2 fL (ref 78.0–100.0)
PLATELETS: 107 10*3/uL — AB (ref 150–400)
RBC: 3.04 MIL/uL — AB (ref 3.87–5.11)
RDW: 17.1 % — ABNORMAL HIGH (ref 11.5–15.5)
WBC: 10.4 10*3/uL (ref 4.0–10.5)

## 2013-11-21 MED ORDER — OXYCODONE-ACETAMINOPHEN 5-325 MG PO TABS: 1.0000 | ORAL_TABLET | ORAL | Status: DC | PRN

## 2013-11-21 MED ORDER — LABETALOL HCL 100 MG PO TABS: 100.0000 mg | ORAL_TABLET | Freq: Two times a day (BID) | ORAL | Status: DC

## 2013-11-21 NOTE — Progress Notes (Signed)
Pt doing well postpartum.  Pain controlled with percocet.  Denies HA, visual changes and RUQ pain.  Pt reports LE swelling.  AF, VSS  BP 130-140/80-90 gen - NAD, ambulating well CV - RRR Lungs - clear bilaterally Abd - soft, NT.  Incision c/d/i Ext + bilateral edema  Labs - reviewed, plts & hgb slightly lower, AST slightly elevated.  A/P:  PPD # 3 s/p c-section for severe pre-e Offered pt continued admission with rpt labs in am vs discharge home with rpt labs and BP check in am Pt elects for discharge and rpt labs/BP check in am.   Pt to report any symptoms of HA, increased pain or bleeding Continue labetalol

## 2013-11-21 NOTE — Discharge Instructions (Signed)
Preeclampsia and Eclampsia °Preeclampsia is a condition of high blood pressure during pregnancy. It can happen at 20 weeks or later in pregnancy. If high blood pressure occurs in the second half of pregnancy with no other symptoms, it is called gestational hypertension and goes away after the baby is born. If any of the symptoms listed below develop with gestational hypertension, it is then called preeclampsia. Eclampsia (convulsions) may follow preeclampsia. This is one of the reasons for regular prenatal checkups. Early diagnosis and treatment are very important to prevent eclampsia. °CAUSES  °There is no known cause of preeclampsia/eclampsia in pregnancy. There are several known conditions that may put the pregnant woman at risk, such as: °· The first pregnancy. °· Having preeclampsia in a past pregnancy. °· Having lasting (chronic) high blood pressure. °· Having multiples (twins, triplets). °· Being age 35 or older. °· African American ethnic background. °· Having kidney disease or diabetes. °· Medical conditions such as lupus or blood diseases. °· Being overweight (obese). °SYMPTOMS  °· High blood pressure. °· Headaches. °· Sudden weight gain. °· Swelling of hands, face, legs, and feet. °· Protein in the urine. °· Feeling sick to your stomach (nauseous) and throwing up (vomiting). °· Vision problems (blurred or double vision). °· Numbness in the face, arms, legs, and feet. °· Dizziness. °· Slurred speech. °· Preeclampsia can cause growth retardation in the fetus. °· Separation (abruption) of the placenta. °· Not enough fluid in the amniotic sac (oligohydramnios). °· Sensitivity to bright lights. °· Belly (abdominal) pain. °DIAGNOSIS  °If protein is found in the urine in the second half of pregnancy, this is considered preeclampsia. Other symptoms mentioned above may also be present. °TREATMENT  °It is necessary to treat this. °· Your caregiver may prescribe bed rest early in this condition. Plenty of rest and  salt restriction may be all that is needed. °· Medicines may be necessary to lower blood pressure if the condition does not respond to more conservative measures. °· In more severe cases, hospitalization may be needed: °· For treatment of blood pressure. °· To control fluid retention. °· To monitor the baby to see if the condition is causing harm to the baby. °· Hospitalization is the best way to treat the first sign of preeclampsia. This is so the mother and baby can be watched closely and blood tests can be done effectively and correctly. °· If the condition becomes severe, it may be necessary to induce labor or to remove the infant by surgical means (cesarean section). The best cure for preeclampsia/eclampsia is to deliver the baby. °Preeclampsia and eclampsia involve risks to mother and infant. Your caregiver will discuss these risks with you. Together, you can work out the best possible approach to your problems. Make sure you keep your prenatal visits as scheduled. Not keeping appointments could result in a chronic or permanent injury, pain, disability to you, and death or injury to you or your unborn baby. If there is any problem keeping the appointment, you must call to reschedule. °HOME CARE INSTRUCTIONS  °· Keep your prenatal appointments and tests as scheduled. °· Tell your caregiver if you have any of the above risk factors. °· Get plenty of rest and sleep. °· Eat a balanced diet that is low in salt, and do not add salt to your food. °· Avoid stressful situations. °· Only take over-the-counter and prescriptions medicines for pain, discomfort, or fever as directed by your caregiver. °SEEK IMMEDIATE MEDICAL CARE IF:  °· You develop severe swelling   anywhere in the body. This usually occurs in the legs. °· You gain 05 lb/2.3 kg or more in a week. °· You develop a severe headache, dizziness, problems with your vision, or confusion. °· You have abdominal pain, nausea, or vomiting. °· You have a seizure. °· You  have trouble moving any part of your body, or you develop numbness or problems speaking. °· You have bruising or abnormal bleeding from anywhere in the body. °· You develop a stiff neck. °· You pass out. °MAKE SURE YOU:  °· Understand these instructions. °· Will watch your condition. °· Will get help right away if you are not doing well or get worse. °Document Released: 09/15/2000 Document Revised: 12/11/2011 Document Reviewed: 05/01/2008 °Exit[employer]® Patient Information ©2014 [employer]. ° °

## 2013-11-21 NOTE — Discharge Summary (Signed)
Obstetric Discharge Summary Reason for Admission: pre-eclampsia with severe features Prenatal Procedures: ultrasound Intrapartum Procedures: cesarean: low cervical, transverse Postpartum Procedures: magnesium sulfate Complications-Operative and Postpartum: none Hemoglobin  Date Value Ref Range Status  11/21/2013 8.5* 12.0 - 15.0 g/dL Final     HCT  Date Value Ref Range Status  11/21/2013 25.9* 36.0 - 46.0 % Final    Physical Exam:  General: alert and cooperative Lochia: appropriate Uterine Fundus: firm Incision: healing well, no significant drainage DVT Evaluation: No evidence of DVT seen on physical exam.  Discharge Diagnoses: Preelampsia  Discharge Information: Date: 11/21/2013 Activity: pelvic rest Diet: routine Medications: PNV, Percocet and labetalol Condition: stable Instructions: refer to practice specific booklet Discharge to: home Follow-up Information   Follow up In 1 day. (to MAU for repeat BP check and repeat labs)       Newborn Data: Live born female  Birth Weight: 1 lb 15 oz (879 g) APGAR: 4, 7  Home with In NICU.  , 11/21/2013, 9:32 AM

## 2013-11-21 NOTE — Progress Notes (Signed)
Subjective: Postpartum Day 4: Cesarean Delivery Patient reports tolerating PO, + flatus, + BM and no problems voiding.  C/o of fluid into thighs. Desires diuretic, is breastfeeding. Denies HA, blurred vision, or RuQ pain  Objective: Vital signs in last 24 hours: Temp:  [98 F (36.7 C)-98.9 F (37.2 C)] 98 F (36.7 C) (02/20 0522) Pulse Rate:  [78-96] 78 (02/20 0522) Resp:  [18] 18 (02/20 0522) BP: (128-144)/(57-90) 142/90 mmHg (02/20 0522) SpO2:  [98 %-100 %] 98 % (02/20 0522) Weight:  [262 lb (118.842 kg)] 262 lb (118.842 kg) (02/20 0522)  Physical Exam:  General: alert Lochia: appropriate Uterine Fundus: firm Incision: healing well DVT Evaluation: No evidence of DVT seen on physical exam. Negative Homan's sign. No cords or calf tenderness. Calf/Ankle edema is present DTR's 2+ no clonus   Recent Labs  11/20/13 0535 11/21/13 0525  HGB 9.2* 8.5*  HCT 27.5* 25.9*    Assessment/Plan: Status post Cesarean section, preeclampsia Discussed use of diuretic while brestfeeding. Continue Labetalol.  Discussed labs and patient concerns with Dr. Mayme GentaAdkins , G 11/21/2013, 8:25 AM

## 2013-11-22 ENCOUNTER — Inpatient Hospital Stay (HOSPITAL_COMMUNITY)
Admission: AD | Admit: 2013-11-22 | Discharge: 2013-11-22 | Disposition: A | Discharge status: Home / Self Care | Payer: 59 | Source: Ambulatory Visit | Attending: Obstetrics and Gynecology | Admitting: Obstetrics and Gynecology

## 2013-11-22 ENCOUNTER — Encounter (HOSPITAL_COMMUNITY): Payer: Self-pay | Admitting: *Deleted

## 2013-11-22 DIAGNOSIS — IMO0002 Reserved for concepts with insufficient information to code with codable children: Secondary | ICD-10-CM | POA: Insufficient documentation

## 2013-11-22 DIAGNOSIS — O1494 Unspecified pre-eclampsia, complicating childbirth: Secondary | ICD-10-CM

## 2013-11-22 LAB — URINE MICROSCOPIC-ADD ON

## 2013-11-22 LAB — COMPREHENSIVE METABOLIC PANEL
ALT: 65 U/L — ABNORMAL HIGH (ref 0–35)
AST: 74 U/L — ABNORMAL HIGH (ref 0–37)
Albumin: 2.1 g/dL — ABNORMAL LOW (ref 3.5–5.2)
Alkaline Phosphatase: 104 U/L (ref 39–117)
BUN: 9 mg/dL (ref 6–23)
CO2: 26 mEq/L (ref 19–32)
Calcium: 8.4 mg/dL (ref 8.4–10.5)
Chloride: 103 mEq/L (ref 96–112)
Creatinine, Ser: 1.08 mg/dL (ref 0.50–1.10)
GFR calc Af Amer: 78 mL/min — ABNORMAL LOW (ref >90)
GFR calc non Af Amer: 68 mL/min — ABNORMAL LOW (ref >90)
Glucose, Bld: 90 mg/dL (ref 70–99)
Potassium: 4.5 mEq/L (ref 3.7–5.3)
Sodium: 138 mEq/L (ref 137–147)
Total Bilirubin: 0.3 mg/dL (ref 0.3–1.2)
Total Protein: 5.5 g/dL — ABNORMAL LOW (ref 6.0–8.3)

## 2013-11-22 LAB — URINALYSIS, ROUTINE W REFLEX MICROSCOPIC
Bilirubin Urine: NEGATIVE
GLUCOSE, UA: NEGATIVE mg/dL
KETONES UR: NEGATIVE mg/dL
LEUKOCYTES UA: NEGATIVE
Nitrite: NEGATIVE
PROTEIN: 100 mg/dL — AB
Specific Gravity, Urine: 1.015 (ref 1.005–1.030)
UROBILINOGEN UA: 0.2 mg/dL (ref 0.0–1.0)
pH: 7 (ref 5.0–8.0)

## 2013-11-22 LAB — CBC
HCT: 27.6 % — ABNORMAL LOW (ref 36.0–46.0)
Hemoglobin: 9.2 g/dL — ABNORMAL LOW (ref 12.0–15.0)
MCH: 28.6 pg (ref 26.0–34.0)
MCHC: 33.3 g/dL (ref 30.0–36.0)
MCV: 85.7 fL (ref 78.0–100.0)
Platelets: 112 10*3/uL — ABNORMAL LOW (ref 150–400)
RBC: 3.22 MIL/uL — ABNORMAL LOW (ref 3.87–5.11)
RDW: 16.9 % — ABNORMAL HIGH (ref 11.5–15.5)
WBC: 12.8 10*3/uL — ABNORMAL HIGH (ref 4.0–10.5)

## 2013-11-22 MED ORDER — LABETALOL HCL 100 MG PO TABS: 200.0000 mg | ORAL_TABLET | Freq: Two times a day (BID) | ORAL | Status: DC

## 2013-11-22 NOTE — Discharge Instructions (Signed)
Preeclampsia and Eclampsia °Preeclampsia is a condition of high blood pressure during pregnancy. It can happen at 20 weeks or later in pregnancy. If high blood pressure occurs in the second half of pregnancy with no other symptoms, it is called gestational hypertension and goes away after the baby is born. If any of the symptoms listed below develop with gestational hypertension, it is then called preeclampsia. Eclampsia (convulsions) may follow preeclampsia. This is one of the reasons for regular prenatal checkups. Early diagnosis and treatment are very important to prevent eclampsia. °CAUSES  °There is no known cause of preeclampsia/eclampsia in pregnancy. There are several known conditions that may put the pregnant woman at risk, such as: °· The first pregnancy. °· Having preeclampsia in a past pregnancy. °· Having lasting (chronic) high blood pressure. °· Having multiples (twins, triplets). °· Being age 35 or older. °· African American ethnic background. °· Having kidney disease or diabetes. °· Medical conditions such as lupus or blood diseases. °· Being overweight (obese). °SYMPTOMS  °· High blood pressure. °· Headaches. °· Sudden weight gain. °· Swelling of hands, face, legs, and feet. °· Protein in the urine. °· Feeling sick to your stomach (nauseous) and throwing up (vomiting). °· Vision problems (blurred or double vision). °· Numbness in the face, arms, legs, and feet. °· Dizziness. °· Slurred speech. °· Preeclampsia can cause growth retardation in the fetus. °· Separation (abruption) of the placenta. °· Not enough fluid in the amniotic sac (oligohydramnios). °· Sensitivity to bright lights. °· Belly (abdominal) pain. °DIAGNOSIS  °If protein is found in the urine in the second half of pregnancy, this is considered preeclampsia. Other symptoms mentioned above may also be present. °TREATMENT  °It is necessary to treat this. °· Your caregiver may prescribe bed rest early in this condition. Plenty of rest and  salt restriction may be all that is needed. °· Medicines may be necessary to lower blood pressure if the condition does not respond to more conservative measures. °· In more severe cases, hospitalization may be needed: °· For treatment of blood pressure. °· To control fluid retention. °· To monitor the baby to see if the condition is causing harm to the baby. °· Hospitalization is the best way to treat the first sign of preeclampsia. This is so the mother and baby can be watched closely and blood tests can be done effectively and correctly. °· If the condition becomes severe, it may be necessary to induce labor or to remove the infant by surgical means (cesarean section). The best cure for preeclampsia/eclampsia is to deliver the baby. °Preeclampsia and eclampsia involve risks to mother and infant. Your caregiver will discuss these risks with you. Together, you can work out the best possible approach to your problems. Make sure you keep your prenatal visits as scheduled. Not keeping appointments could result in a chronic or permanent injury, pain, disability to you, and death or injury to you or your unborn baby. If there is any problem keeping the appointment, you must call to reschedule. °HOME CARE INSTRUCTIONS  °· Keep your prenatal appointments and tests as scheduled. °· Tell your caregiver if you have any of the above risk factors. °· Get plenty of rest and sleep. °· Eat a balanced diet that is low in salt, and do not add salt to your food. °· Avoid stressful situations. °· Only take over-the-counter and prescriptions medicines for pain, discomfort, or fever as directed by your caregiver. °SEEK IMMEDIATE MEDICAL CARE IF:  °· You develop severe swelling   anywhere in the body. This usually occurs in the legs. °· You gain 05 lb/2.3 kg or more in a week. °· You develop a severe headache, dizziness, problems with your vision, or confusion. °· You have abdominal pain, nausea, or vomiting. °· You have a seizure. °· You  have trouble moving any part of your body, or you develop numbness or problems speaking. °· You have bruising or abnormal bleeding from anywhere in the body. °· You develop a stiff neck. °· You pass out. °MAKE SURE YOU:  °· Understand these instructions. °· Will watch your condition. °· Will get help right away if you are not doing well or get worse. °Document Released: 09/15/2000 Document Revised: 12/11/2011 Document Reviewed: 05/01/2008 °Exit[employer]® Patient Information ©2014 [employer]. ° °

## 2013-11-22 NOTE — MAU Note (Signed)
Patient was instructed by her physician to come to MAU today for a BP check and labwork (Magnesium Sulfate during labor).

## 2013-11-22 NOTE — Progress Notes (Signed)
Dr. Renaldo FiddlerAdkins notified pt in MAU, orders obtained for CBC, CMET. RN will call with results.

## 2013-11-22 NOTE — Progress Notes (Signed)
Pt discharged home yesterday, delivered for pre-eclampsia with severe features.  Infant in NICU.  Pt evaluated by RN today - denies HA, visual changes and RUQ pain.  LE swelling is unchanged.  BP 150-170/90s Abd  - soft, NT per RN exam  Labs reviewed - Hgb and Plts & kidney function tests improved.  LFTs slightly worse  A/P:  Plan to increase labetalol to 200mg  po bid Rpt labs and eval tomorrow. Strict precautions reviewed to return with any symptoms of HA, n/v, or abd pain

## 2013-11-22 NOTE — MAU Provider Note (Signed)
History     CSN: [id number]  Arrival date and time: 11/22/13 1025   First Provider Initiated Contact with Patient 11/22/13 1138      Chief Complaint  Patient presents with  . Postpartum Complications    Labs and BP check   HPI  [name] is a 33 y.o. female, status post cesarean birth of a 27 week viable fetus on 11/17/2013 for severe preeclampsia. Pt was discharged home from the hospital yesterday (baby is in NICU), patient was instructed by Dr. Orvan Seen to return to MAU today for lab work and blood pressure check. Patient denies blurred vision; does have some mild lower extremity edema.  Pt has taken her labetalol today.   OB History   Grav Para Term Preterm Abortions TAB SAB Ect Mult Living   '3 3  3     1 3      '$ Past Medical History  Diagnosis Date  . Pregnancy induced hypertension   . Preterm labor     Past Surgical History  Procedure Laterality Date  . Cesarean section    . Cesarean section N/A 11/18/2013    Procedure: CESAREAN SECTION;  Surgeon: Linda Hedges, DO;  Location: [facility];  Service: Obstetrics;  Laterality: N/A;    History reviewed. No pertinent family history.  History  Substance Use Topics  . Smoking status: Never Smoker   . Smokeless tobacco: Never Used  . Alcohol Use: No    Allergies: No Known Allergies  Prescriptions prior to admission  Medication Sig Dispense Refill  . labetalol (NORMODYNE) 100 MG tablet Take 1 tablet (100 mg total) by mouth 2 (two) times daily.  60 tablet  1  . oxyCODONE-acetaminophen (PERCOCET/ROXICET) 5-325 MG per tablet Take 1-2 tablets by mouth every 4 (four) hours as needed for severe pain (moderate - severe pain).  30 tablet  0  . Prenatal Vit-Fe Fumarate-FA (PRENATAL MULTIVITAMIN) TABS tablet Take 1 tablet by mouth daily at 12 noon.       Results for orders placed during the hospital encounter of 11/22/13 (from the past 48 hour(s))  URINALYSIS, ROUTINE W REFLEX MICROSCOPIC     Status: Abnormal   Collection  Time    11/22/13 10:33 AM      Result Value Ref Range   Color, Urine YELLOW  YELLOW   APPearance CLEAR  CLEAR   Specific Gravity, Urine 1.015  1.005 - 1.030   pH 7.0  5.0 - 8.0   Glucose, UA NEGATIVE  NEGATIVE mg/dL   Hgb urine dipstick MODERATE (*) NEGATIVE   Bilirubin Urine NEGATIVE  NEGATIVE   Ketones, ur NEGATIVE  NEGATIVE mg/dL   Protein, ur 100 (*) NEGATIVE mg/dL   Urobilinogen, UA 0.2  0.0 - 1.0 mg/dL   Nitrite NEGATIVE  NEGATIVE   Leukocytes, UA NEGATIVE  NEGATIVE  URINE MICROSCOPIC-ADD ON     Status: Abnormal   Collection Time    11/22/13 10:33 AM      Result Value Ref Range   Squamous Epithelial / LPF FEW (*) RARE   WBC, UA 0-2  <3 WBC/hpf   RBC / HPF 3-6  <3 RBC/hpf   Bacteria, UA FEW (*) RARE  CBC     Status: Abnormal   Collection Time    11/22/13 11:00 AM      Result Value Ref Range   WBC 12.8 (*) 4.0 - 10.5 K/uL   RBC 3.22 (*) 3.87 - 5.11 MIL/uL   Hemoglobin 9.2 (*) 12.0 - 15.0 g/dL  HCT 27.6 (*) 36.0 - 46.0 %   MCV 85.7  78.0 - 100.0 fL   MCH 28.6  26.0 - 34.0 pg   MCHC 33.3  30.0 - 36.0 g/dL   RDW 57.2 (*) 42.4 - 24.4 %   Platelets 112 (*) 150 - 400 K/uL   Comment: REPEATED TO VERIFY     SPECIMEN CHECKED FOR CLOTS     PLATELET COUNT CONFIRMED BY SMEAR  COMPREHENSIVE METABOLIC PANEL     Status: Abnormal   Collection Time    11/22/13 11:00 AM      Result Value Ref Range   Sodium 138  137 - 147 mEq/L   Potassium 4.5  3.7 - 5.3 mEq/L   Chloride 103  96 - 112 mEq/L   CO2 26  19 - 32 mEq/L   Glucose, Bld 90  70 - 99 mg/dL   BUN 9  6 - 23 mg/dL   Creatinine, Ser 4.53  0.50 - 1.10 mg/dL   Calcium 8.4  8.4 - 67.6 mg/dL   Total Protein 5.5 (*) 6.0 - 8.3 g/dL   Albumin 2.1 (*) 3.5 - 5.2 g/dL   AST 74 (*) 0 - 37 U/L   ALT 65 (*) 0 - 35 U/L   Alkaline Phosphatase 104  39 - 117 U/L   Total Bilirubin 0.3  0.3 - 1.2 mg/dL   GFR calc non Af Amer 68 (*) >90 mL/min   GFR calc Af Amer 78 (*) >90 mL/min   Comment: (NOTE)     The eGFR has been calculated using  the CKD EPI equation.     This calculation has not been validated in all clinical situations.     eGFR's persistently <90 mL/min signify possible Chronic Kidney     Disease.    Review of Systems  Constitutional: Negative for fever and chills.  Eyes: Negative for blurred vision and double vision.  Cardiovascular: Positive for leg swelling.  Gastrointestinal: Negative for nausea, vomiting and abdominal pain.  Genitourinary: Negative for dysuria, urgency, frequency, hematuria and flank pain.       No vaginal discharge. + vaginal bleeding; "very light vaginal bleeding". No dysuria.   Neurological: Negative for headaches.   Physical Exam   Blood pressure 159/88, pulse 82, temperature 98.8 F (37.1 C), temperature source Oral, resp. rate 20, height 5\' 7"  (1.702 m), weight 118.842 kg (262 lb), last menstrual period 05/10/2013, unknown if currently breastfeeding.  Physical Exam  Constitutional: She is oriented to person, place, and time. She appears well-developed and well-nourished.  Non-toxic appearance. She does not have a sickly appearance. She does not appear ill. No distress.  HENT:  Head: Normocephalic.  Eyes: Pupils are equal, round, and reactive to light.  Neck: Neck supple.  Respiratory: Effort normal.  GI: Soft. She exhibits no distension and no mass. There is no tenderness. There is no rebound and no guarding.  Musculoskeletal: Normal range of motion.       Right ankle: She exhibits swelling.       Left ankle: She exhibits swelling.  Neurological: She is alert and oriented to person, place, and time. She has normal reflexes.  Skin: Skin is warm. She is not diaphoretic.  Psychiatric: Her behavior is normal.    MAU Course  Procedures None  MDM CBC CMET UA  Consulted with Dr. 07/10/2013 who would like the patient to return tomorrow for lab work and serial BP's Increase Labetalol to 200 mg BID  Limited percocet; ok to take  ibuprofen   Assessment and Plan   A:  1.  Pre-eclampsia, delivered   2.  Elevated BP post delivery   P: Discharge home in stable condition Return to MAU tomorrow for blood work and BP readings Return to MAU if symptoms worsen Increase labetalol to 200 mg BID  Limited percocet use; ok to take ibuprofen as needed, as directed on the bottle  Warning signs discussed and when to seek care.   Darrelyn Hillock , NP  11/22/2013, 4:35 PM

## 2013-11-23 ENCOUNTER — Inpatient Hospital Stay (HOSPITAL_COMMUNITY)
Admission: AD | Admit: 2013-11-23 | Discharge: 2013-11-23 | Disposition: A | Discharge status: Home / Self Care | Payer: 59 | Source: Ambulatory Visit | Attending: Obstetrics and Gynecology | Admitting: Obstetrics and Gynecology

## 2013-11-23 ENCOUNTER — Encounter (HOSPITAL_COMMUNITY): Payer: Self-pay | Admitting: *Deleted

## 2013-11-23 DIAGNOSIS — O1495 Unspecified pre-eclampsia, complicating the puerperium: Secondary | ICD-10-CM

## 2013-11-23 DIAGNOSIS — IMO0002 Reserved for concepts with insufficient information to code with codable children: Secondary | ICD-10-CM | POA: Insufficient documentation

## 2013-11-23 LAB — COMPREHENSIVE METABOLIC PANEL
ALT: 62 U/L — ABNORMAL HIGH (ref 0–35)
AST: 55 U/L — ABNORMAL HIGH (ref 0–37)
Albumin: 2.1 g/dL — ABNORMAL LOW (ref 3.5–5.2)
Alkaline Phosphatase: 96 U/L (ref 39–117)
BILIRUBIN TOTAL: 0.3 mg/dL (ref 0.3–1.2)
BUN: 10 mg/dL (ref 6–23)
CHLORIDE: 105 meq/L (ref 96–112)
CO2: 25 mEq/L (ref 19–32)
CREATININE: 1.11 mg/dL — AB (ref 0.50–1.10)
Calcium: 8.6 mg/dL (ref 8.4–10.5)
GFR calc non Af Amer: 65 mL/min — ABNORMAL LOW (ref >90)
GFR, EST AFRICAN AMERICAN: 76 mL/min — AB (ref >90)
GLUCOSE: 79 mg/dL (ref 70–99)
POTASSIUM: 4.5 meq/L (ref 3.7–5.3)
Sodium: 141 mEq/L (ref 137–147)
TOTAL PROTEIN: 5.6 g/dL — AB (ref 6.0–8.3)

## 2013-11-23 LAB — CBC
HCT: 27.3 % — ABNORMAL LOW (ref 36.0–46.0)
HEMOGLOBIN: 9 g/dL — AB (ref 12.0–15.0)
MCH: 28.3 pg (ref 26.0–34.0)
MCHC: 33 g/dL (ref 30.0–36.0)
MCV: 85.8 fL (ref 78.0–100.0)
Platelets: 135 10*3/uL — ABNORMAL LOW (ref 150–400)
RBC: 3.18 MIL/uL — ABNORMAL LOW (ref 3.87–5.11)
RDW: 17.1 % — ABNORMAL HIGH (ref 11.5–15.5)
WBC: 9.4 10*3/uL (ref 4.0–10.5)

## 2013-11-23 MED ORDER — LABETALOL HCL 100 MG PO TABS: 200.0000 mg | ORAL_TABLET | Freq: Three times a day (TID) | ORAL | Status: DC

## 2013-11-23 NOTE — MAU Note (Signed)
Pt not in lobby.  

## 2013-11-23 NOTE — MAU Note (Signed)
Pt not in lobby, called NICU and she is there visiting her baby, states she will come to MAU to be evaluated now

## 2013-11-23 NOTE — MAU Note (Signed)
Assumed care of patient.

## 2013-11-23 NOTE — Discharge Instructions (Signed)
Hypertension During Pregnancy Hypertension is also called high blood pressure. It can occur at any time in life and during pregnancy. When you have hypertension, there is extra pressure inside your blood vessels that carry blood from the heart to the rest of your body (arteries). Hypertension during pregnancy can cause problems for you and your baby. Your baby might not weigh as much as it should at birth or might be born early (premature). Very bad cases of hypertension during pregnancy can be life threatening.  Different types of hypertension can occur during pregnancy.   Chronic hypertension. This happens when a woman has hypertension before pregnancy and it continues during pregnancy.  Gestational hypertension. This is when hypertension develops during pregnancy.  Preeclampsia or toxemia of pregnancy. This is a very serious type of hypertension that develops only during pregnancy. It is a disease that affects the whole body (systemic) and can be very dangerous for both mother and baby.  Gestational hypertension and preeclampsia usually go away after your baby is born. Blood pressure generally stabilizes within 6 weeks. Women who have hypertension during pregnancy have a greater chance of developing hypertension later in life or with future pregnancies. RISK FACTORS Some factors make you more likely to develop hypertension during pregnancy. Risk factors include:  Having hypertension before pregnancy.  Having hypertension during a previous pregnancy.  Being overweight.  Being older than 40.  Being pregnant with more than one baby (multiples).  Having diabetes or kidney problems. SIGNS AND SYMPTOMS Chronic and gestational hypertension rarely cause symptoms. Preeclampsia has symptoms, which may include:  Increased protein in your urine. Your health care provider will check for this at every prenatal visit.  Swelling of your hands and face.  Rapid weight gain.  Headaches.  Visual  changes.  Being bothered by light.  Abdominal pain, especially in the right upper area.  Chest pain.  Shortness of breath.  Increased reflexes.  Seizures. Seizures occur with a more severe form of preeclampsia, called eclampsia. DIAGNOSIS   You may be diagnosed with hypertension during a regular prenatal exam. At each visit, tests may include:  Blood pressure checks.  A urine test to check for protein in your urine.  The type of hypertension you are diagnosed with depends on when you developed it. It also depends on your specific blood pressure reading.  Developing hypertension before 20 weeks of pregnancy is consistent with chronic hypertension.  Developing hypertension after 20 weeks of pregnancy is consistent with gestational hypertension.  Hypertension with increased urinary protein is diagnosed as preeclampsia.  Blood pressure measurements that stay above 160 systolic or 110 diastolic are a sign of severe preeclampsia. TREATMENT Treatment for hypertension during pregnancy varies. Treatment depends on the type of hypertension and how serious it is.  If you take medicine for chronic hypertension, you may need to switch medicines.  Drugs called ACE inhibitors should not be taken during pregnancy.  Low-dose aspirin may be suggested for women who have risk factors for preeclampsia.  If you have gestational hypertension, you may need to take a blood pressure medicine that is safe during pregnancy. Your health care provider will recommend the appropriate medicine.  If you have severe preeclampsia, you may need to be in the hospital. Health care providers will watch you and the baby very closely. You also may need to take medicine (magnesium sulfate) to prevent seizures and lower blood pressure.  Sometimes an early delivery is needed. This may be the case if the condition worsens. It would   be done to protect you and the baby. The only cure for preeclampsia is delivery. HOME  CARE INSTRUCTIONS  Schedule and keep all of your regular appointments for prenatal care.  Only take over-the-counter or prescription medicines as directed by your health care provider. Tell your health care provider about all medicines you take.  Eat as little salt as possible.  Get regular exercise.  Do not drink alcohol.  Do not use tobacco products.  Do not drink products with caffeine.  Lie on your left side when resting. SEEK IMMEDIATE MEDICAL CARE IF:  You have severe abdominal pain.  You have sudden swelling in the hands, ankles, or face.  You gain 4 pounds (1.8 kg) or more in 1 week.  You vomit repeatedly.  You have vaginal bleeding.  You do not feel the baby moving as much.  You have a headache.  You have blurred or double vision.  You have muscle twitching or spasms.  You have shortness of breath.  You have blue fingernails and lips.  You have blood in your urine. MAKE SURE YOU:  Understand these instructions.  Will watch your condition.  Will get help right away if you are not doing well or get worse. Document Released: 06/06/2011 Document Revised: 07/09/2013 Document Reviewed: 04/17/2013 [employer] Patient Information 2014 [employer].  

## 2013-11-23 NOTE — MAU Provider Note (Signed)
History     CSN: [id number]  Arrival date and time: 11/23/13 1221   First Provider Initiated Contact with Patient 11/23/13 1332      Chief Complaint  Patient presents with  . Hypertension    Postpartum BP check and labwork   HPI This is a 33 y.o. female who is post delivery with severe preeclampsia. She was seen yesterday and meds adjusted for BP.  Today she feels fine, denies headache, visual changes or abdominal pain. Baby is doing well in NICU. Still has some edema, but not worsened.   Previous MD Note:  Pt discharged home yesterday, delivered for pre-eclampsia with severe features. Infant in NICU. Pt evaluated by RN today - denies HA, visual changes and RUQ pain. LE swelling is unchanged.  BP 150-170/90s  Abd - soft, NT per RN exam  Labs reviewed - Hgb and Plts & kidney function tests improved. LFTs slightly worse  A/P: Plan to increase labetalol to 200mg  po bid  Rpt labs and eval tomorrow.  Strict precautions reviewed to return with any symptoms of HA, n/v, or abd pain       OB History   Grav Para Term Preterm Abortions TAB SAB Ect Mult Living   3 3  3     1 3       Past Medical History  Diagnosis Date  . Pregnancy induced hypertension   . Preterm labor     Past Surgical History  Procedure Laterality Date  . Cesarean section    . Cesarean section N/A 11/18/2013    Procedure: CESAREAN SECTION;  Surgeon: Mitchel HonourMegan Morris, DO;  Location: [facility];  Service: Obstetrics;  Laterality: N/A;    No family history on file.  History  Substance Use Topics  . Smoking status: Never Smoker   . Smokeless tobacco: Never Used  . Alcohol Use: No    Allergies: No Known Allergies  Prescriptions prior to admission  Medication Sig Dispense Refill  . labetalol (NORMODYNE) 100 MG tablet Take 2 tablets (200 mg total) by mouth 2 (two) times daily.  60 tablet  1  . oxyCODONE-acetaminophen (PERCOCET/ROXICET) 5-325 MG per tablet Take 1-2 tablets by mouth every 4 (four) hours as needed  for severe pain (moderate - severe pain).  30 tablet  0  . Prenatal Vit-Fe Fumarate-FA (PRENATAL MULTIVITAMIN) TABS tablet Take 1 tablet by mouth daily at 12 noon.        Review of Systems  Constitutional: Negative for fever, chills and malaise/fatigue.  Eyes: Negative for blurred vision and double vision.  Cardiovascular: Negative for chest pain.  Gastrointestinal: Negative for nausea, vomiting and abdominal pain.  Neurological: Negative for dizziness, focal weakness and headaches.   Physical Exam   Blood pressure 167/106, pulse 81, last menstrual period 05/10/2013, unknown if currently breastfeeding.  Filed Vitals:   11/23/13 1345 11/23/13 1400 11/23/13 1415 11/23/13 1431  BP: 170/111 167/106 168/103 168/93  Pulse: 83 81 79 82    Physical Exam  Constitutional: She is oriented to person, place, and time. She appears well-developed and well-nourished. No distress.  HENT:  Head: Normocephalic.  Neck: Normal range of motion. Neck supple.  Cardiovascular: Normal rate.   Respiratory: Effort normal.  GI: Soft. She exhibits no distension. There is no tenderness. There is no rebound and no guarding.  Musculoskeletal: Normal range of motion. She exhibits edema (lower legs). She exhibits no tenderness.  Neurological: She is alert and oriented to person, place, and time. She has normal reflexes.  Skin: Skin  is warm and dry.  Psychiatric: She has a normal mood and affect.    MAU Course  Procedures  MDM Results for orders placed during the hospital encounter of 11/23/13 (from the past 24 hour(s))  CBC     Status: Abnormal   Collection Time    11/23/13  1:40 PM      Result Value Ref Range   WBC 9.4  4.0 - 10.5 K/uL   RBC 3.18 (*) 3.87 - 5.11 MIL/uL   Hemoglobin 9.0 (*) 12.0 - 15.0 g/dL   HCT 16.1 (*) 09.6 - 04.5 %   MCV 85.8  78.0 - 100.0 fL   MCH 28.3  26.0 - 34.0 pg   MCHC 33.0  30.0 - 36.0 g/dL   RDW 40.9 (*) 81.1 - 91.4 %   Platelets 135 (*) 150 - 400 K/uL  COMPREHENSIVE  METABOLIC PANEL     Status: Abnormal   Collection Time    11/23/13  1:40 PM      Result Value Ref Range   Sodium 141  137 - 147 mEq/L   Potassium 4.5  3.7 - 5.3 mEq/L   Chloride 105  96 - 112 mEq/L   CO2 25  19 - 32 mEq/L   Glucose, Bld 79  70 - 99 mg/dL   BUN 10  6 - 23 mg/dL   Creatinine, Ser 7.82 (*) 0.50 - 1.10 mg/dL   Calcium 8.6  8.4 - 95.6 mg/dL   Total Protein 5.6 (*) 6.0 - 8.3 g/dL   Albumin 2.1 (*) 3.5 - 5.2 g/dL   AST 55 (*) 0 - 37 U/L   ALT 62 (*) 0 - 35 U/L   Alkaline Phosphatase 96  39 - 117 U/L   Total Bilirubin 0.3  0.3 - 1.2 mg/dL   GFR calc non Af Amer 65 (*) >90 mL/min   GFR calc Af Amer 76 (*) >90 mL/min     Assessment and Plan  A:  Postpartum preeclampsia      Labs improved      Hypertension remains  P:  Discussed with Dr Renaldo Fiddler       Will increase Labetalol from bid to tid       Followup in office this week  [facility] 11/23/2013, 2:21 PM

## 2013-11-23 NOTE — MAU Note (Signed)
Patient arrived for scheduled serial BP checks and labwork.

## 2013-11-24 ENCOUNTER — Encounter: Payer: Self-pay | Admitting: *Deleted

## 2013-11-29 LAB — CORD BLOOD GAS (ARTERIAL)

## 2013-12-09 ENCOUNTER — Telehealth: Payer: Self-pay | Admitting: *Deleted

## 2013-12-09 NOTE — Telephone Encounter (Signed)
Spoke with patient via phone.  Explained to patient I had sent her a letter 11/24/13 notifying her of an appointment.  This patient was scheduled in error.  I explained to patient and apologized for any confusion or inconvenience I may have caused.  Patient stated understanding.

## 2013-12-15 ENCOUNTER — Ambulatory Visit: Payer: 59 | Admitting: Family Medicine

## 2014-07-17 ENCOUNTER — Other Ambulatory Visit: Payer: Self-pay

## 2014-08-03 ENCOUNTER — Encounter (HOSPITAL_COMMUNITY): Payer: Self-pay | Admitting: *Deleted

## 2015-05-20 LAB — TYPE AND SCREEN
ABO/RH(D): B POS
Antibody Screen: POSITIVE
DAT, IgG: NEGATIVE
PT AG Type: NEGATIVE
UNIT DIVISION: 0
UNIT TAG COMMENT: NEGATIVE
UNIT TAG COMMENT: NEGATIVE
Unit division: 0
Unit division: 0
Unit division: 0

## 2015-10-27 IMAGING — US US OB DETAIL+14 WK
1 series · 12 of 28 positions shown · non-contrast
Comparison: none

[Series 1: us ob detail+14 wk · 0.21mm/px · 67 acquisitions, 12 frames shown]
[im 3/67]
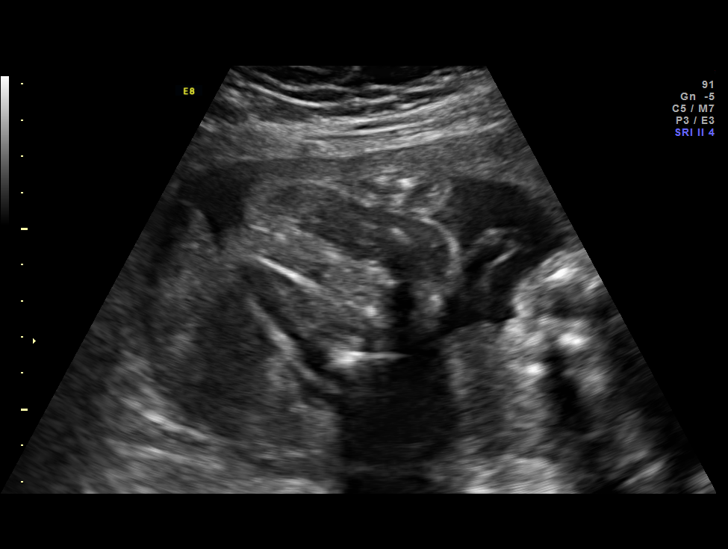
[im 8/67]
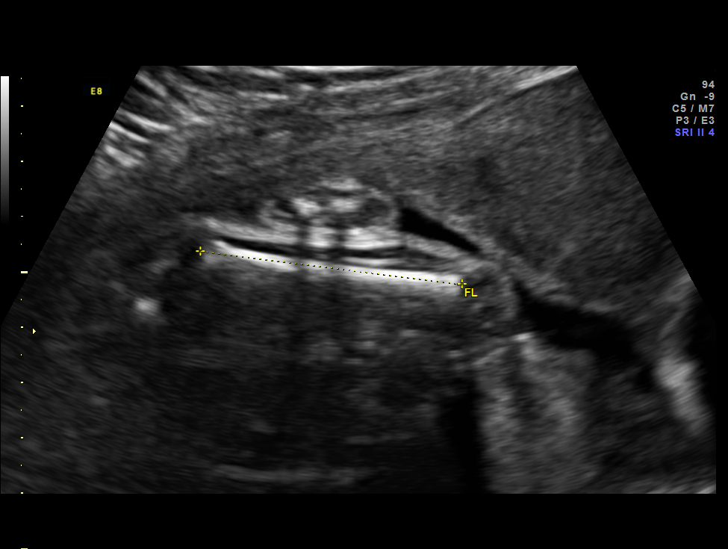
[im 13/67]
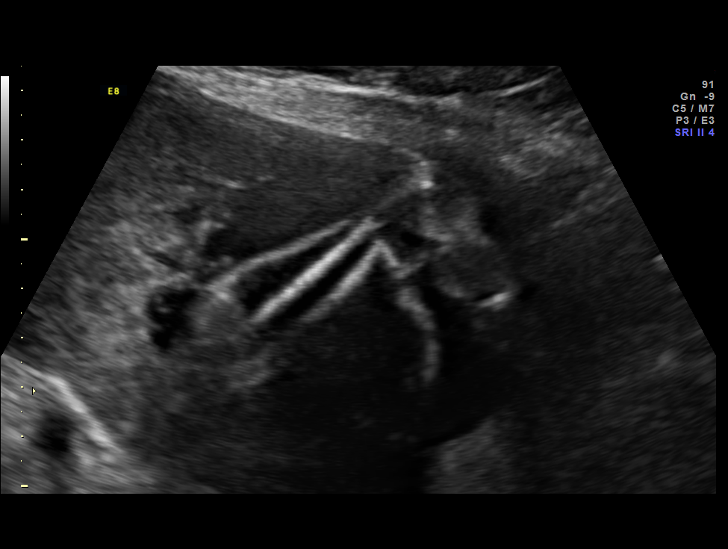
[im 20/67]
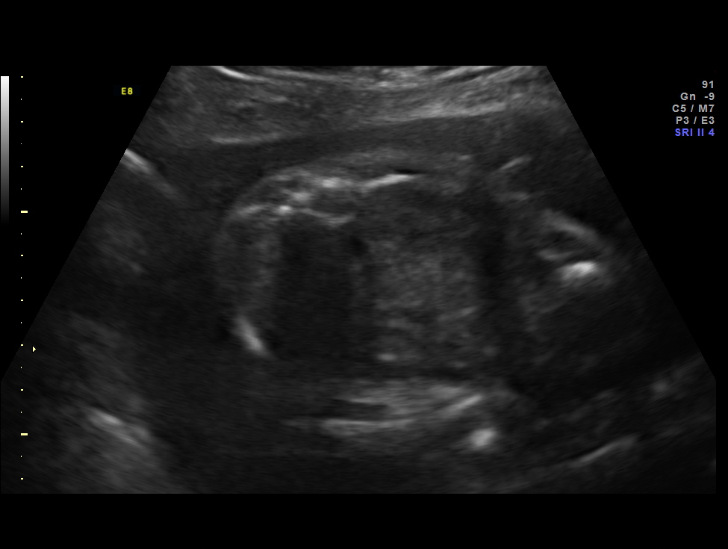
[im 25/67]
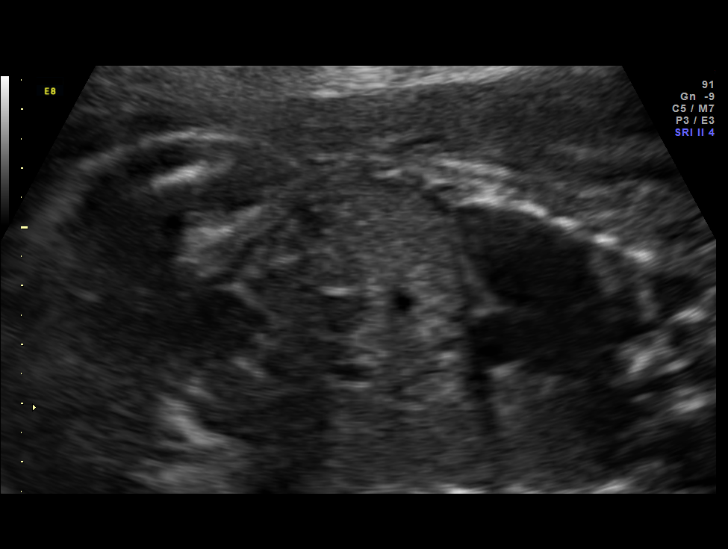
[im 30/67]
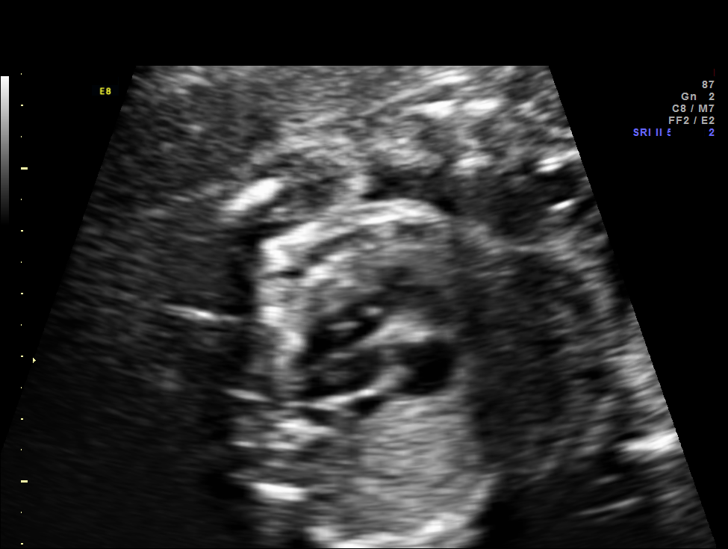
[im 37/67]
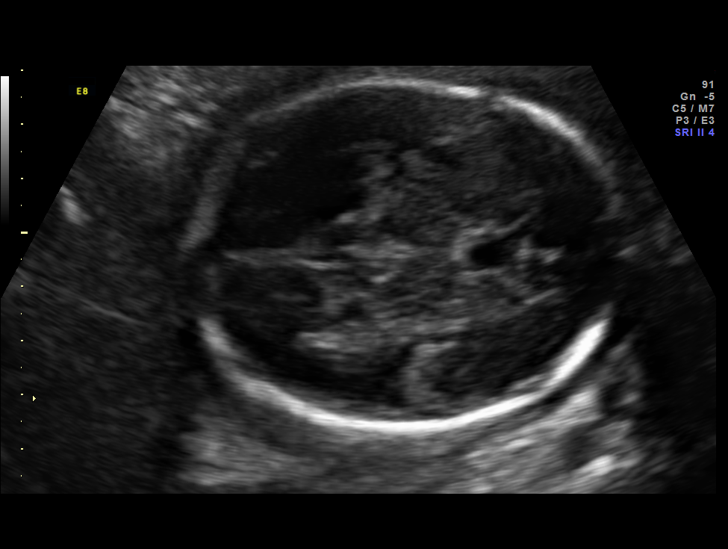
[im 42/67]
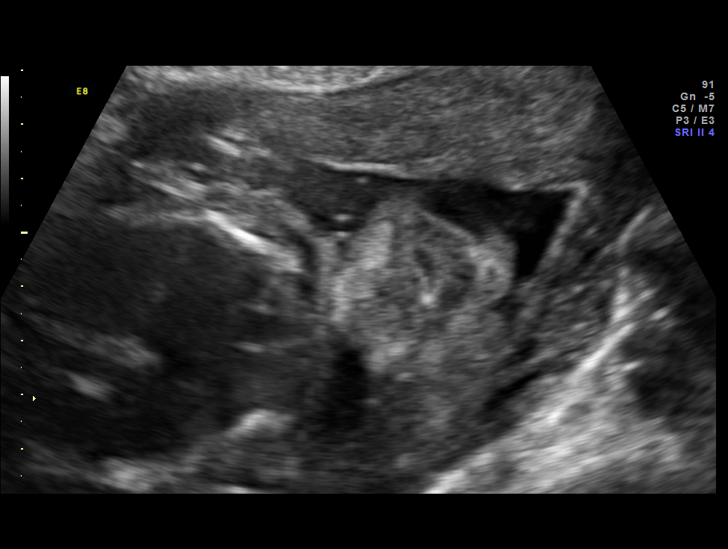
[im 47/67]
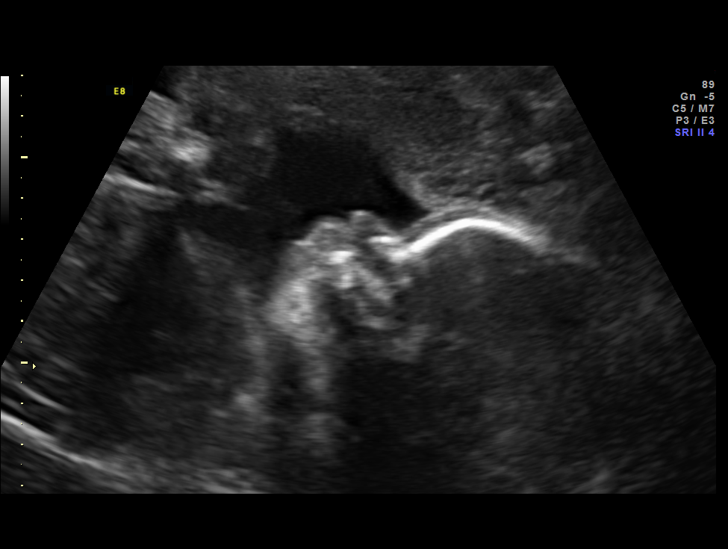
[im 54/67]
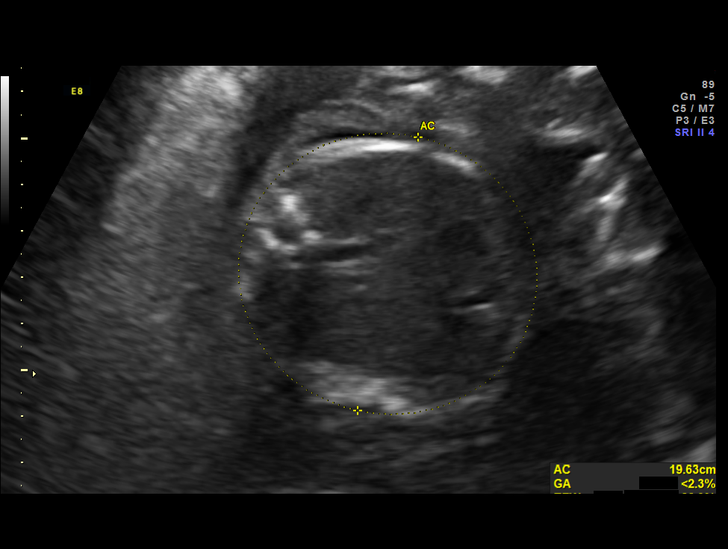
[im 59/67]
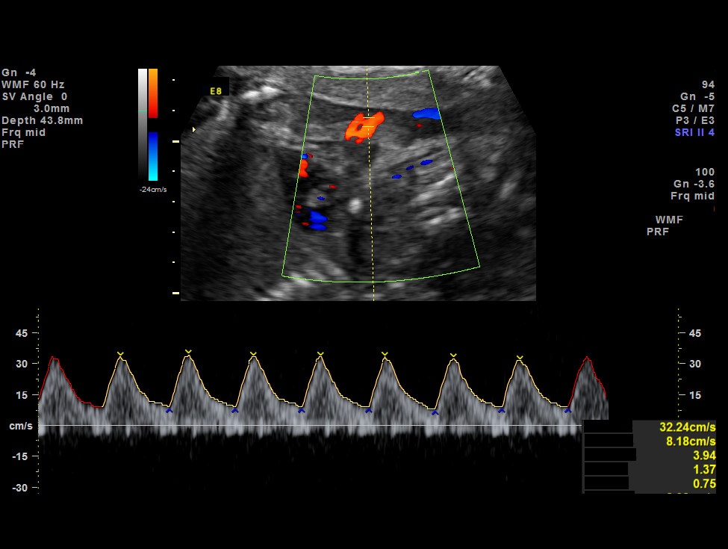
[im 64/67]
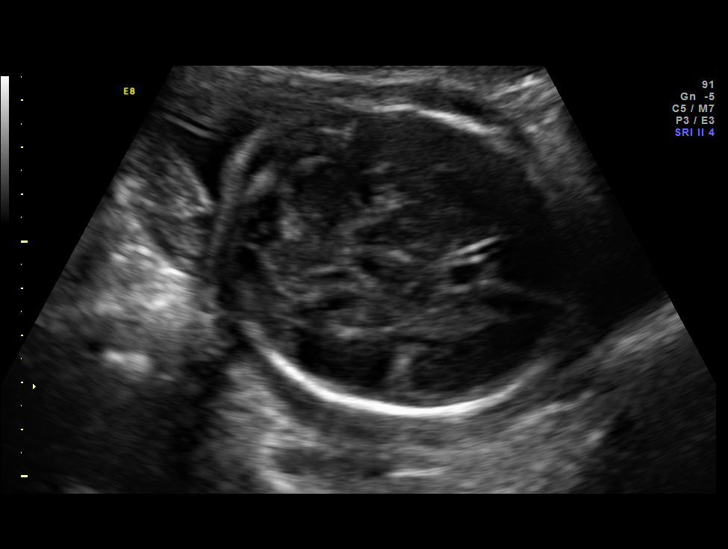

[12 of 28 positions shown; findings below may reference images not displayed]

OBSTETRICS REPORT
                      (Signed Final 11/12/2013 [DATE])

Service(s) Provided

 US OB DETAIL + 14 WK                                  76811.0
 US UA CORD DOPPLER                                    76820.0
Indications

 Detailed fetal anatomic survey
 Poor obstetric history: Previous preterm delivery x
 2 (29 weeks and 26 week twins - IUFD of co-twin)
 Poor obstetric history: Previous severe
 preeclampsia x 2
 Previous cesarean section x 2
 Anti-E alloimmunization
Fetal Evaluation

 Num Of Fetuses:    1
 Fetal Heart Rate:  150                          bpm
 Cardiac Activity:  Observed
 Presentation:      Cephalic
 Placenta:          Posterior, above cervical
                    os
 P. Cord            Not well visualized
 Insertion:

 Amniotic Fluid
 AFI FV:      Subjectively within normal limits
                                             Larg Pckt:     3.3  cm
Biometry

 BPD:     62.3  mm     G. Age:  25w 2d                CI:         74.8   70 - 86
 OFD:     83.3  mm                                    FL/HC:      20.1   18.6 -

 HC:     233.7  mm     G. Age:  25w 3d        4  %    HC/AC:      1.22   1.05 -

 AC:     191.5  mm     G. Age:  23w 6d      < 3  %    FL/BPD:     75.3   71 - 87
 FL:      46.9  mm     G. Age:  25w 5d       13  %    FL/AC:      24.5   20 - 24
 HUM:       44  mm     G. Age:  26w 1d       37  %

 Est. FW:     730  gm    1 lb 10 oz      20  %
Gestational Age

 LMP:           26w 4d        Date:  05/10/13                 EDD:   02/14/14
 U/S Today:     25w 1d                                        EDD:   02/24/14
 Best:          26w 4d     Det. By:  LMP  (05/10/13)          EDD:   02/14/14
Anatomy

 Cranium:          Appears normal         Aortic Arch:      Appears normal
 Fetal Cavum:      Appears normal         Ductal Arch:      Not well visualized
 Ventricles:       Appears normal         Diaphragm:        Appears normal
 Choroid Plexus:   Appears normal         Stomach:          Appears normal, left
                                                            sided
 Cerebellum:       Appears normal         Abdomen:          Appears normal
 Posterior Fossa:  Appears normal         Abdominal Wall:   Not well visualized
 Nuchal Fold:      Not applicable (>20    Cord Vessels:     Appears normal (3
                   wks GA)                                  vessel cord)
 Face:             Appears normal         Kidneys:          Appear normal
                   (orbits and profile)
 Lips:             Appears normal         Bladder:          Appears normal
 Heart:            Appears normal         Spine:            Appears normal
                   (4CH, axis, and
                   situs)
 RVOT:             Appears normal         Lower             Appears normal
                                          Extremities:
 LVOT:             Appears normal         Upper             Appears normal
                                          Extremities:
Doppler - Fetal Vessels

 Umbilical Artery
 S/D:   3.89           78  %tile       RI:
 PI:    1.33                           PSV:       32.24   cm/s
 Umbilical Artery
 Absent DFV:    No     Reverse DFV:    No

Cervix Uterus Adnexa

 Cervix:       Not visualized
Impression

 IUP at 26+4 weeks
 Normal detailed fetal anatomy; limited views of DA and CI
 Normal to low normal amniotic fluid volume
 EFW at the 20th %tile; AC < 3rd %tile
 UA dopplers were normal for this GA
Recommendations

 Weekly BPPs, AFIs and UA dopplers
 Growth US in 3 weeks

## 2021-09-23 ENCOUNTER — Other Ambulatory Visit: Payer: Self-pay

## 2021-09-23 ENCOUNTER — Encounter (HOSPITAL_BASED_OUTPATIENT_CLINIC_OR_DEPARTMENT_OTHER): Payer: Self-pay | Admitting: *Deleted

## 2021-09-23 DIAGNOSIS — J181 Lobar pneumonia, unspecified organism: Secondary | ICD-10-CM | POA: Insufficient documentation

## 2021-09-23 DIAGNOSIS — Z8616 Personal history of COVID-19: Secondary | ICD-10-CM | POA: Insufficient documentation

## 2021-09-23 MED ORDER — ALBUTEROL SULFATE HFA 108 (90 BASE) MCG/ACT IN AERS: 2.0000 | INHALATION_SPRAY | RESPIRATORY_TRACT | Status: DC | PRN

## 2021-09-23 NOTE — ED Triage Notes (Signed)
Pt c/o sob and states she has a sharp pain under right breast that radiates around to her back when she takes a deep breath. Pt states she recently had covid

## 2021-09-24 ENCOUNTER — Emergency Department (HOSPITAL_BASED_OUTPATIENT_CLINIC_OR_DEPARTMENT_OTHER): Payer: Self-pay

## 2021-09-24 ENCOUNTER — Emergency Department (HOSPITAL_BASED_OUTPATIENT_CLINIC_OR_DEPARTMENT_OTHER)
Admission: EM | Admit: 2021-09-24 | Discharge: 2021-09-24 | Disposition: A | Discharge status: Home / Self Care | Payer: Self-pay | Attending: Emergency Medicine | Admitting: Emergency Medicine

## 2021-09-24 DIAGNOSIS — J189 Pneumonia, unspecified organism: Secondary | ICD-10-CM

## 2021-09-24 DIAGNOSIS — R0781 Pleurodynia: Secondary | ICD-10-CM

## 2021-09-24 LAB — COMPREHENSIVE METABOLIC PANEL
ALT: 31 U/L (ref 0–44)
AST: 33 U/L (ref 15–41)
Albumin: 4.1 g/dL (ref 3.5–5.0)
Alkaline Phosphatase: 77 U/L (ref 38–126)
Anion gap: 7 (ref 5–15)
BUN: 14 mg/dL (ref 6–20)
CO2: 28 mmol/L (ref 22–32)
Calcium: 9.3 mg/dL (ref 8.9–10.3)
Chloride: 101 mmol/L (ref 98–111)
Creatinine, Ser: 1.06 mg/dL — ABNORMAL HIGH (ref 0.44–1.00)
GFR, Estimated: >60 mL/min (ref >60)
Glucose, Bld: 103 mg/dL — ABNORMAL HIGH (ref 70–99)
Potassium: 4.6 mmol/L (ref 3.5–5.1)
Sodium: 136 mmol/L (ref 135–145)
Total Bilirubin: 0.6 mg/dL (ref 0.3–1.2)
Total Protein: 7.3 g/dL (ref 6.5–8.1)

## 2021-09-24 LAB — CBC WITH DIFFERENTIAL/PLATELET
Abs Immature Granulocytes: 0.02 10*3/uL (ref 0.00–0.07)
Basophils Absolute: 0 10*3/uL (ref 0.0–0.1)
Basophils Relative: 0 %
Eosinophils Absolute: 0.1 10*3/uL (ref 0.0–0.5)
Eosinophils Relative: 1 %
HCT: 44.3 % (ref 36.0–46.0)
Hemoglobin: 14.7 g/dL (ref 12.0–15.0)
Immature Granulocytes: 0 %
Lymphocytes Relative: 32 %
Lymphs Abs: 1.9 10*3/uL (ref 0.7–4.0)
MCH: 31.4 pg (ref 26.0–34.0)
MCHC: 33.2 g/dL (ref 30.0–36.0)
MCV: 94.7 fL (ref 80.0–100.0)
Monocytes Absolute: 0.6 10*3/uL (ref 0.1–1.0)
Monocytes Relative: 10 %
Neutro Abs: 3.3 10*3/uL (ref 1.7–7.7)
Neutrophils Relative %: 57 %
Platelets: 315 10*3/uL (ref 150–400)
RBC: 4.68 MIL/uL (ref 3.87–5.11)
RDW: 12.8 % (ref 11.5–15.5)
WBC: 5.9 10*3/uL (ref 4.0–10.5)
nRBC: 0 % (ref 0.0–0.2)

## 2021-09-24 LAB — PREGNANCY, URINE: Preg Test, Ur: NEGATIVE

## 2021-09-24 LAB — D-DIMER, QUANTITATIVE: D-Dimer, Quant: 0.48 ug/mL-FEU (ref 0.00–0.50)

## 2021-09-24 MED ORDER — HYDROCODONE-ACETAMINOPHEN 5-325 MG PO TABS: 1.0000 | ORAL_TABLET | ORAL | 0 refills | Status: DC | PRN

## 2021-09-24 MED ORDER — HYDROCODONE-ACETAMINOPHEN 5-325 MG PO TABS
1.0000 | ORAL_TABLET | Freq: Once | ORAL | Status: AC
  Administered 2021-09-24: 04:00:00 1 via ORAL
  Filled 2021-09-24: qty 1

## 2021-09-24 MED ORDER — IPRATROPIUM-ALBUTEROL 0.5-2.5 (3) MG/3ML IN SOLN
3.0000 mL | Freq: Once | RESPIRATORY_TRACT | Status: AC
  Administered 2021-09-24: 02:00:00 3 mL via RESPIRATORY_TRACT
  Filled 2021-09-24: qty 3

## 2021-09-24 MED ORDER — AMOXICILLIN 500 MG PO CAPS
1000.0000 mg | ORAL_CAPSULE | Freq: Once | ORAL | Status: AC
  Administered 2021-09-24: 04:00:00 1000 mg via ORAL
  Filled 2021-09-24: qty 2

## 2021-09-24 MED ORDER — AMOXICILLIN 500 MG PO CAPS: 1000.0000 mg | ORAL_CAPSULE | Freq: Two times a day (BID) | ORAL | 0 refills | Status: DC

## 2021-09-24 MED ORDER — IOHEXOL 350 MG/ML SOLN
100.0000 mL | Freq: Once | INTRAVENOUS | Status: AC | PRN
  Administered 2021-09-24: 03:00:00 63 mL via INTRAVENOUS

## 2021-09-24 NOTE — ED Provider Notes (Signed)
[facility] EMERGENCY DEPT Provider Note   CSN: [id number] Arrival date & time: 09/23/21  2344     History Chief Complaint  Patient presents with   Shortness of Breath    Sarah Ayala is a 40 y.o. female.  The history is provided by the patient.  Shortness of Breath She was diagnosed with COVID-19 1 week ago.  She was generally doing well until 2 days ago when she started having pain in her right shoulder which is worse with movement, and also pain in her the right side of her chest when she took a deep breath.  She has noted some dyspnea when she exerts herself.  Pain is relatively mild at rest and she rates it at 3/10.  However, with deep breath or cough, pain goes up to 8/10.  She has not run a fever since the first 2 days of her COVID-19 infection.  There has been no nausea, vomiting, diarrhea.  She has taken over-the-counter acetaminophen and ibuprofen without relief.   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 No pertinent  family history.  Social History   Tobacco Use   Smoking status: Never   Smokeless tobacco: Never  Substance Use Topics   Alcohol use: Yes    Comment: occasionally   Drug use: No    Home Medications Prior to Admission medications   Medication Sig Start Date End Date Taking? Authorizing Provider  labetalol (NORMODYNE) 100 MG tablet Take 2 tablets (200 mg total) by mouth 2 (two) times daily. 11/22/13   Rasch, Anderson Malta I, NP  labetalol (NORMODYNE) 100 MG tablet Take 2 tablets (200 mg total) by mouth 3 (three) times daily. 11/23/13   Seabron Spates, CNM  oxyCODONE-acetaminophen (PERCOCET/ROXICET) 5-325 MG per tablet Take 1-2 tablets by mouth every 4 (four) hours as needed for severe pain (moderate - severe pain). 11/21/13   Marylynn Pearson, MD  Prenatal Vit-Fe Fumarate-FA (PRENATAL MULTIVITAMIN) TABS tablet Take 1 tablet by mouth daily at 12 noon.    [provider]    Allergies    Patient has no known allergies.  Review of Systems   Review of Systems  Respiratory:  Positive for shortness of breath.   All other systems reviewed and are negative.  Physical Exam Updated Vital Signs BP  130/90    Pulse (!) 104    Temp 98.1 F (36.7 C)    Resp 16    Ht 5\' 7"  (1.702 m)    Wt 127.9 kg    SpO2 100%    BMI 44.17 kg/m   Physical Exam Vitals and nursing note reviewed.  40 year old female, resting comfortably and in no acute distress. Vital signs are significant for slightly elevated heart rate. Oxygen saturation is 100%, which is normal. Head is normocephalic and atraumatic. PERRLA, EOMI. Oropharynx is clear. Neck is nontender and supple without adenopathy or JVD. Back is nontender and there is no CVA tenderness. Lungs are clear without rales, wheezes, or rhonchi. Chest is moderately tender in the right inframammary area.  No crepitus is present.  Palpation of the chest wall does reproduce her pain. Heart has regular rate and rhythm without murmur. Abdomen is soft, flat,  nontender. Extremities have no cyanosis or edema, full range of motion is present.  Mild pain is elicited with range of motion of the right shoulder. Skin is warm and dry without rash. Neurologic: Mental status is normal, cranial nerves are intact, moves all extremities equally.  ED Results / Procedures / Treatments   Labs (all labs ordered are listed, but only abnormal results are displayed) Labs Reviewed  COMPREHENSIVE METABOLIC PANEL - Abnormal; Notable for the following components:      Result Value   Glucose, Bld 103 (*)    Creatinine, Ser 1.06 (*)    All other components within normal limits  CBC WITH DIFFERENTIAL/PLATELET  D-DIMER, QUANTITATIVE  PREGNANCY, URINE    EKG EKG Interpretation  Date/Time:  Friday September 23 2021 23:55:59 EST Ventricular Rate:  105 PR Interval:  114 QRS Duration: 74 QT Interval:  328 QTC Calculation: 433 R Axis:   61 Text Interpretation: Sinus tachycardia Otherwise normal ECG No old tracing to compare Confirmed by 01-25-2007 (Dione Booze) on 09/23/2021 11:58:01 PM  Radiology CT Angio Chest PE W and/or Wo Contrast  Result Date: 09/24/2021 CLINICAL DATA:  Concern for pulmonary embolism. EXAM: CT ANGIOGRAPHY CHEST WITH CONTRAST TECHNIQUE: Multidetector CT imaging of the chest was performed using the standard protocol during bolus administration of intravenous contrast. Multiplanar CT image reconstructions and MIPs were obtained to evaluate the vascular anatomy. CONTRAST:  61mL OMNIPAQUE IOHEXOL 350 MG/ML SOLN COMPARISON:  Chest radiograph dated 09/24/2021. FINDINGS: Cardiovascular: No cardiomegaly or pericardial effusion. The thoracic aorta is unremarkable. The origins of the great vessels of the aortic arch appear patent. Evaluation of the pulmonary arteries is limited due to suboptimal opacification and timing of the contrast. No obvious large or central pulmonary artery embolus identified. Mediastinum/Nodes: No hilar or mediastinal adenopathy. The  esophagus is grossly unremarkable. No mediastinal fluid collection. Lungs/Pleura: Trace right pleural effusion. Bibasilar linear and streaky densities, likely atelectasis. Infiltrate is not excluded. No pneumothorax. The central airways are patent. Upper Abdomen: No acute abnormality. Musculoskeletal: No chest wall abnormality. No acute or significant osseous findings. Review of the MIP images confirms the above findings. IMPRESSION: 1. No CT evidence of central pulmonary artery embolus. 2. Trace right pleural effusion with bibasilar linear and streaky densities, likely atelectasis. Infiltrate is not excluded. Electronically Signed   By: 09/26/2021 M.D.   On: 09/24/2021 03:32   DG Chest Portable 1 View  Result Date: 09/24/2021 CLINICAL DATA:  Shortness of breath EXAM: PORTABLE CHEST 1 VIEW COMPARISON:  None. FINDINGS: The heart size and mediastinal contours are within normal limits. Both lungs are clear.  The visualized skeletal structures are unremarkable. IMPRESSION: No active disease. Electronically Signed   By: Ulyses Jarred M.D.   On: 09/24/2021 00:31    Procedures Procedures   Medications Ordered in ED Medications  amoxicillin (AMOXIL) capsule 1,000 mg (has no administration in time range)  HYDROcodone-acetaminophen (NORCO/VICODIN) 5-325 MG per tablet 1 tablet (has no administration in time range)  ipratropium-albuterol (DUONEB) 0.5-2.5 (3) MG/3ML nebulizer solution 3 mL (3 mLs Nebulization Given 09/24/21 0210)  iohexol (OMNIPAQUE) 350 MG/ML injection 100 mL (63 mLs Intravenous Contrast Given 09/24/21 0309)    ED Course  I have reviewed the triage vital signs and the nursing notes.  Pertinent labs & imaging results that were available during my care of the patient were reviewed by me and considered in my medical decision making (see chart for details).    MDM Rules/Calculators/A&P                         Pleuritic chest pain in setting of COVID-19 infection.  She is at increased  risk for pulmonary embolism because of this.  Also consider a superimposed pneumonia, viral pleurisy.  Chest x-ray shows no evidence of pneumonia.  Labs are unremarkable including normal D-dimer.  However, because of high risk of pulmonary embolism, will need to do CT angiogram in spite of normal D-dimer.  She will also be given therapeutic trial of albuterol with ipratropium.  Old records are reviewed, and she has no relevant past visits.  CT angiogram is read as no embolism, probable atelectasis although infiltrate not excluded.  I reviewed the images and there does appear to be an early infiltrate in the right lower lobe which matches where patient is having symptoms.  This will be treated as a pneumonia.  She is given a dose of amoxicillin and is discharged with prescription for same.  She noted no change in symptoms with albuterol with ipratropium, so no beta agonists are prescribed.  She also states that pain has not improved with ibuprofen and acetaminophen, she is given a prescription for small number of hydrocodone-acetaminophen tablets.  Return precautions discussed.  Final Clinical Impression(s) / ED Diagnoses Final diagnoses:  Pleuritic chest pain  Community acquired pneumonia of right lower lobe of lung    Rx / DC Orders ED Discharge Orders          Ordered    amoxicillin (AMOXIL) 500 MG capsule  2 times daily        09/24/21 0344    HYDROcodone-acetaminophen (NORCO) 5-325 MG tablet  Every 4 hours PRN        09/24/21 0000000             Delora Fuel, MD [id number] [phone]

## 2021-09-24 NOTE — Discharge Instructions (Addendum)
Continue to take ibuprofen and/your acetaminophen as needed for pain.  Reserve hydrocodone-acetaminophen for severe pain.  Return to the emergency department if your symptoms are getting worse.

## 2021-09-26 NOTE — ED Notes (Signed)
Opened chart at pts request to provide a work note. °

## 2023-05-20 ENCOUNTER — Emergency Department (HOSPITAL_BASED_OUTPATIENT_CLINIC_OR_DEPARTMENT_OTHER)
Admission: EM | Admit: 2023-05-20 | Discharge: 2023-05-20 | Disposition: A | Discharge status: Home / Self Care | Payer: No Typology Code available for payment source | Attending: Emergency Medicine | Admitting: Emergency Medicine

## 2023-05-20 ENCOUNTER — Other Ambulatory Visit: Payer: Self-pay

## 2023-05-20 ENCOUNTER — Encounter (HOSPITAL_BASED_OUTPATIENT_CLINIC_OR_DEPARTMENT_OTHER): Payer: Self-pay

## 2023-05-20 DIAGNOSIS — T7840XA Allergy, unspecified, initial encounter: Secondary | ICD-10-CM | POA: Insufficient documentation

## 2023-05-20 LAB — BASIC METABOLIC PANEL
Anion gap: 12 (ref 5–15)
BUN: 12 mg/dL (ref 6–20)
CO2: 19 mmol/L — ABNORMAL LOW (ref 22–32)
Calcium: 8.9 mg/dL (ref 8.9–10.3)
Chloride: 106 mmol/L (ref 98–111)
Creatinine, Ser: 1.17 mg/dL — ABNORMAL HIGH (ref 0.44–1.00)
GFR, Estimated: >60 mL/min (ref >60)
Glucose, Bld: 151 mg/dL — ABNORMAL HIGH (ref 70–99)
Potassium: 3.4 mmol/L — ABNORMAL LOW (ref 3.5–5.1)
Sodium: 137 mmol/L (ref 135–145)

## 2023-05-20 LAB — CBC WITH DIFFERENTIAL/PLATELET
Abs Immature Granulocytes: 0.02 10*3/uL (ref 0.00–0.07)
Basophils Absolute: 0 10*3/uL (ref 0.0–0.1)
Basophils Relative: 0 %
Eosinophils Absolute: 0.1 10*3/uL (ref 0.0–0.5)
Eosinophils Relative: 2 %
HCT: 42.7 % (ref 36.0–46.0)
Hemoglobin: 14.6 g/dL (ref 12.0–15.0)
Immature Granulocytes: 0 %
Lymphocytes Relative: 63 %
Lymphs Abs: 4.4 10*3/uL — ABNORMAL HIGH (ref 0.7–4.0)
MCH: 31.9 pg (ref 26.0–34.0)
MCHC: 34.2 g/dL (ref 30.0–36.0)
MCV: 93.4 fL (ref 80.0–100.0)
Monocytes Absolute: 0.7 10*3/uL (ref 0.1–1.0)
Monocytes Relative: 10 %
Neutro Abs: 1.7 10*3/uL (ref 1.7–7.7)
Neutrophils Relative %: 25 %
Platelets: 330 10*3/uL (ref 150–400)
RBC: 4.57 MIL/uL (ref 3.87–5.11)
RDW: 12.9 % (ref 11.5–15.5)
WBC: 7 10*3/uL (ref 4.0–10.5)
nRBC: 0 % (ref 0.0–0.2)

## 2023-05-20 MED ORDER — PREDNISONE 10 MG PO TABS: 20.0000 mg | ORAL_TABLET | Freq: Two times a day (BID) | ORAL | 0 refills | Status: DC

## 2023-05-20 MED ORDER — FAMOTIDINE IN NACL 20-0.9 MG/50ML-% IV SOLN
20.0000 mg | Freq: Once | INTRAVENOUS | Status: AC
Start: 2023-05-20 — End: 2023-05-20

## 2023-05-20 MED ORDER — FAMOTIDINE IN NACL 20-0.9 MG/50ML-% IV SOLN
INTRAVENOUS | Status: AC
  Administered 2023-05-20: 20 mg via INTRAVENOUS
  Filled 2023-05-20: qty 50

## 2023-05-20 MED ORDER — DIPHENHYDRAMINE HCL 50 MG/ML IJ SOLN
INTRAMUSCULAR | Status: AC
  Administered 2023-05-20: 25 mg via INTRAVENOUS
  Filled 2023-05-20: qty 1

## 2023-05-20 MED ORDER — DIPHENHYDRAMINE HCL 50 MG/ML IJ SOLN
25.0000 mg | Freq: Once | INTRAMUSCULAR | Status: AC
Start: 2023-05-20 — End: 2023-05-20

## 2023-05-20 MED ORDER — EPINEPHRINE 0.3 MG/0.3ML IJ SOAJ: 0.3000 mg | Freq: Once | INTRAMUSCULAR | Status: AC

## 2023-05-20 MED ORDER — METHYLPREDNISOLONE SODIUM SUCC 125 MG IJ SOLR
125.0000 mg | Freq: Once | INTRAMUSCULAR | Status: AC
Start: 2023-05-20 — End: 2023-05-20

## 2023-05-20 MED ORDER — METHYLPREDNISOLONE SODIUM SUCC 125 MG IJ SOLR
INTRAMUSCULAR | Status: AC
  Administered 2023-05-20: 125 mg via INTRAVENOUS
  Filled 2023-05-20: qty 2

## 2023-05-20 MED ORDER — EPINEPHRINE 0.3 MG/0.3ML IJ SOAJ
INTRAMUSCULAR | Status: AC
  Administered 2023-05-20: 0.3 mg via INTRAMUSCULAR
  Filled 2023-05-20: qty 0.3

## 2023-05-20 NOTE — ED Notes (Signed)
RT Note:  Patient stated her tongue felt heavy and swollen but her breath sounds are clear and she doesn't appear to be in distress at this time. She appears to be anxious with elevated HR.

## 2023-05-20 NOTE — ED Triage Notes (Signed)
POV from home, A&O x 4, GCS 15, amb to room  Pt c/o facial swelling, tongue swelling, itchy throat, and rash to face. Unknown allergy.

## 2023-05-20 NOTE — ED Provider Notes (Addendum)
[place] EMERGENCY DEPARTMENT AT [facility] Provider Note   CSN: [id number] Arrival date & time: 05/20/23  7829     History  Chief Complaint  Patient presents with   Allergic Reaction    Sarah Ayala is a 42 y.o. female.  Patient is a 42 year old female presenting with complaints of possible allergic reaction.  She woke from sleep approximately 15 minutes prior to presentation with the sensation that her hands and feet were itching.  She then states that she had swelling of her tongue.  She denies any new contacts or exposures.  She denies any difficulty breathing or swallowing.  The history is provided by the patient.       Home Medications Prior to Admission medications   Medication Sig Start Date End Date Taking? Authorizing Provider  amoxicillin (AMOXIL) 500 MG capsule Take 2 capsules (1,000 mg total) by mouth 2 (two) times daily. 09/24/21   Dione Booze, MD  HYDROcodone-acetaminophen (NORCO) 5-325 MG tablet Take 1 tablet by mouth every 4 (four) hours as needed for moderate pain. 09/24/21   Dione Booze, MD  labetalol (NORMODYNE) 100 MG tablet Take 2 tablets (200 mg total) by mouth 2 (two) times daily. 11/22/13   Rasch, Victorino Dike I, NP  labetalol (NORMODYNE) 100 MG tablet Take 2 tablets (200 mg total) by mouth 3 (three) times daily. 11/23/13   Aviva Signs, CNM  oxyCODONE-acetaminophen (PERCOCET/ROXICET) 5-325 MG per tablet Take 1-2 tablets by mouth every 4 (four) hours as needed for severe pain (moderate - severe pain). 11/21/13   Zelphia Cairo, MD  Prenatal Vit-Fe Fumarate-FA (PRENATAL MULTIVITAMIN) TABS tablet Take 1 tablet by mouth daily at 12 noon.    [provider]      Allergies    Patient has no known allergies.    Review of Systems   Review of Systems  All other systems reviewed and are negative.   Physical Exam Updated Vital Signs BP 114/77   Pulse (!) 144   Temp 97.8 F (36.6 C) (Oral)   Resp (!) 22   Ht 5\' 7"  (1.702 m)    Wt 127.9 kg   SpO2 96%   BMI 44.16 kg/m  Physical Exam Vitals and nursing note reviewed.  Constitutional:      General: She is not in acute distress.    Appearance: She is well-developed. She is not diaphoretic.  HENT:     Head: Normocephalic and atraumatic.  Cardiovascular:     Rate and Rhythm: Normal rate and regular rhythm.     Heart sounds: No murmur heard.    No friction rub. No gallop.  Pulmonary:     Effort: Pulmonary effort is normal. No respiratory distress.     Breath sounds: Normal breath sounds. No wheezing.  Abdominal:     General: Bowel sounds are normal. There is no distension.     Palpations: Abdomen is soft.     Tenderness: There is no abdominal tenderness.  Musculoskeletal:        General: Normal range of motion.     Cervical back: Normal range of motion and neck supple.  Skin:    General: Skin is warm and dry.  Neurological:     General: No focal deficit present.     Mental Status: She is alert and oriented to person, place, and time.     ED Results / Procedures / Treatments   Labs (all labs ordered are listed, but only abnormal results are displayed) Labs Reviewed - No  data to display  EKG None  Radiology No results found.  Procedures Procedures    Medications Ordered in ED Medications  diphenhydrAMINE (BENADRYL) 50 MG/ML injection (has no administration in time range)  methylPREDNISolone sodium succinate (SOLU-MEDROL) 125 mg/2 mL injection (has no administration in time range)  famotidine (PEPCID) 20-0.9 MG/50ML-% IVPB (has no administration in time range)  EPINEPHrine (EPI-PEN) 0.3 mg/0.3 mL injection (has no administration in time range)  famotidine (PEPCID) IVPB 20 mg premix (has no administration in time range)  methylPREDNISolone sodium succinate (SOLU-MEDROL) 125 mg/2 mL injection 125 mg (has no administration in time range)  diphenhydrAMINE (BENADRYL) injection 25 mg (has no administration in time range)  EPINEPHrine (EPI-PEN)  injection 0.3 mg (has no administration in time range)    ED Course/ Medical Decision Making/ A&P  Patient is a 42 year old female presenting with complaints of itching to her hands and feet and swelling of her tongue and mouth.  This started acutely in the night and woke her from sleep.  She arrives here with stable vital signs and is afebrile.  There is no hypoxia.  CBC and basic metabolic obtained, both of which were unremarkable.  She has been given Solu-Medrol, Pepcid, Benadryl, and epinephrine due to complaints of oral/airway involvement.  She has been observed for several hours and symptoms have markedly improved.  At this point, I feel as though she can safely be discharged with prednisone and Benadryl.  To return as needed.  The cause of the allergic reaction unclear.  Final Clinical Impression(s) / ED Diagnoses Final diagnoses:  None    Rx / DC Orders ED Discharge Orders     None         Geoffery Lyons, MD 05/20/23 2130    Geoffery Lyons, MD 05/20/23 [phone]

## 2023-05-20 NOTE — Discharge Instructions (Signed)
Begin taking prednisone as prescribed.  Begin taking Benadryl 25 mg every 6 hours for the next 3 days.  Return to the ER if symptoms significantly worsen or change.

## 2023-05-23 ENCOUNTER — Encounter: Payer: Self-pay | Admitting: Family Medicine

## 2023-05-23 ENCOUNTER — Ambulatory Visit (INDEPENDENT_AMBULATORY_CARE_PROVIDER_SITE_OTHER): Payer: No Typology Code available for payment source | Admitting: Family Medicine

## 2023-05-23 VITALS — BP 122/82 | HR 80 | Temp 97.6°F | Ht 67.0 in | Wt 296.0 lb

## 2023-05-23 DIAGNOSIS — Z7689 Persons encountering health services in other specified circumstances: Secondary | ICD-10-CM

## 2023-05-23 DIAGNOSIS — Z789 Other specified health status: Secondary | ICD-10-CM | POA: Diagnosis not present

## 2023-05-23 DIAGNOSIS — R4189 Other symptoms and signs involving cognitive functions and awareness: Secondary | ICD-10-CM | POA: Diagnosis not present

## 2023-05-23 DIAGNOSIS — G478 Other sleep disorders: Secondary | ICD-10-CM

## 2023-05-23 DIAGNOSIS — Z87892 Personal history of anaphylaxis: Secondary | ICD-10-CM

## 2023-05-23 DIAGNOSIS — R5383 Other fatigue: Secondary | ICD-10-CM

## 2023-05-23 DIAGNOSIS — R739 Hyperglycemia, unspecified: Secondary | ICD-10-CM | POA: Diagnosis not present

## 2023-05-23 LAB — LIPID PANEL
Cholesterol: 189 mg/dL (ref 0–200)
HDL: 47.9 mg/dL
LDL Cholesterol: 122 mg/dL — ABNORMAL HIGH (ref 0–99)
NonHDL: 140.94
Total CHOL/HDL Ratio: 4
Triglycerides: 95 mg/dL (ref 0.0–149.0)
VLDL: 19 mg/dL (ref 0.0–40.0)

## 2023-05-23 LAB — COMPREHENSIVE METABOLIC PANEL WITH GFR
ALT: 16 U/L (ref 0–35)
AST: 14 U/L (ref 0–37)
Albumin: 3.8 g/dL (ref 3.5–5.2)
Alkaline Phosphatase: 72 U/L (ref 39–117)
BUN: 14 mg/dL (ref 6–23)
CO2: 27 meq/L (ref 19–32)
Calcium: 8.8 mg/dL (ref 8.4–10.5)
Chloride: 105 meq/L (ref 96–112)
Creatinine, Ser: 1.29 mg/dL — ABNORMAL HIGH (ref 0.40–1.20)
GFR: 51.64 mL/min — ABNORMAL LOW
Glucose, Bld: 94 mg/dL (ref 70–99)
Potassium: 3.5 meq/L (ref 3.5–5.1)
Sodium: 139 meq/L (ref 135–145)
Total Bilirubin: 0.5 mg/dL (ref 0.2–1.2)
Total Protein: 6.7 g/dL (ref 6.0–8.3)

## 2023-05-23 LAB — HEMOGLOBIN A1C: Hgb A1c MFr Bld: 5.9 % (ref 4.6–6.5)

## 2023-05-23 MED ORDER — EPINEPHRINE 0.3 MG/0.3ML IJ SOAJ: 0.3000 mg | INTRAMUSCULAR | 0 refills | Status: DC | PRN

## 2023-05-23 NOTE — Assessment & Plan Note (Signed)
Check labs to look for vitamin deficiencies

## 2023-05-23 NOTE — Assessment & Plan Note (Signed)
Check lab to look for underlying etiology.  Consider home sleep test at her follow-up visit.

## 2023-05-23 NOTE — Assessment & Plan Note (Signed)
Consider screening for sleep apnea at her follow-up in 4 weeks.

## 2023-05-23 NOTE — Assessment & Plan Note (Signed)
Recommend a low sugar, low carbohydrate diet.  Further discuss at her follow-up in 4 weeks.

## 2023-05-23 NOTE — Assessment & Plan Note (Signed)
Check labs including thyroid function

## 2023-05-23 NOTE — Patient Instructions (Signed)
Thank you for trusting [place] with your health care.  Please go downstairs for labs before you leave.  I prescribed EpiPen's for you in case of another allergic reaction.  If you need to use an EpiPen, you will still need to call 911 or go to the emergency department.  I have also referred you to an allergist and they will call you to schedule a visit.  We will be in touch with your lab results and recommendations.  I recommend a follow-up in 4 weeks.

## 2023-05-23 NOTE — Assessment & Plan Note (Signed)
Unknown trigger.  Reviewed recent ED visit notes and results.  EpiPen prescribed.  Discussed the need to call 911 or go to the ED if she requires the use of an EpiPen. Referral to allergist per request.

## 2023-05-23 NOTE — Progress Notes (Signed)
New Patient Office Visit  Subjective    Patient ID: Sarah Ayala, female    DOB: 1981/09/14  Age: 42 y.o. MRN: [id number]  CC:  Chief Complaint  Patient presents with   Establish Care    Not sure if perimenopause, experiencing brain fog and fatigue     HPI Sarah Ayala presents to establish care OB/GYN- [facility], Dr. Rana Snare   Last OB/GYN visit in May.   Recent allergic reaction. Went to ED for anaphylaxis.  She is on prednisone course and Benadryl now. Back to baseline.  Unclear etiology for reaction. Requests referral to allergist.   Fatigue and brain fog x 2 months. Non restorative sleep  Feels tired in mornings and has headaches occasionally.  Has IUD   Good mood   Concern that she is overweight. She has not been exercising.   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   Never smoker     Outpatient Encounter Medications as of 05/23/2023  Medication Sig   EPINEPHrine (EPIPEN 2-PAK) 0.3 mg/0.3 mL IJ SOAJ injection Inject 0.3 mg into the muscle as needed for anaphylaxis.   levonorgestrel (MIRENA, 52 MG,) 20 MCG/DAY IUD Take by intrauterine route.   predniSONE (DELTASONE) 10 MG tablet Take 2 tablets (20 mg total) by mouth 2 (two) times daily with a meal.   [DISCONTINUED] amoxicillin (AMOXIL) 500 MG capsule Take 2 capsules (1,000 mg total) by mouth 2 (two) times daily.   [DISCONTINUED] HYDROcodone-acetaminophen (NORCO) 5-325 MG tablet Take 1 tablet by mouth every 4 (four) hours as needed for moderate pain.   [DISCONTINUED] labetalol (NORMODYNE) 100 MG tablet Take 2 tablets (200 mg total) by mouth 2 (two) times daily.   [DISCONTINUED] labetalol (NORMODYNE) 100 MG tablet Take 2 tablets (200 mg total) by mouth 3 (three) times daily.   [DISCONTINUED] oxyCODONE-acetaminophen (PERCOCET/ROXICET) 5-325 MG per tablet Take 1-2 tablets by mouth every 4 (four) hours as needed for severe pain (moderate - severe pain).   [DISCONTINUED] Prenatal  Vit-Fe Fumarate-FA (PRENATAL MULTIVITAMIN) TABS tablet Take 1 tablet by mouth daily at 12 noon.   No facility-administered encounter medications on file as of 05/23/2023.    Past Medical History:  Diagnosis Date   Pregnancy induced hypertension    Preterm labor     Past Surgical History:  Procedure Laterality Date   BREAST REDUCTION SURGERY     BREAST SURGERY  08/2016   Breast rwsuction surgery   CESAREAN SECTION     CESAREAN SECTION N/A 11/18/2013   Procedure: CESAREAN SECTION;  Surgeon: Mitchel Honour, DO;  Location: [facility];  Service: Obstetrics;  Laterality: N/A;    Family History  Problem Relation Age of Onset   Arthritis Mother    Depression Mother    Cancer Father    Arthritis Maternal Grandmother    Cancer Maternal Grandmother    Diabetes Maternal Grandmother    Asthma Daughter    Asthma Son    Asthma Son     Social History   Socioeconomic History   Marital status: Married    Spouse name: Not on file   Number of children: Not on file   Years of education: Not on file   Highest education level: Not on file  Occupational History   Not on file  Tobacco Use   Smoking status: Never   Smokeless tobacco: Never  Substance and Sexual Activity   Alcohol use: Yes    Alcohol/week: 3.0 standard drinks of alcohol  Types: 3 Glasses of wine per week    Comment: occasionally   Drug use: No   Sexual activity: Yes    Birth control/protection: I.U.D., None    Comment: Mirena 2020  Other Topics Concern   Not on file  Social History Narrative   Not on file   Social Determinants of Health   Financial Resource Strain: Not on file  Food Insecurity: Not on file  Transportation Needs: Not on file  Physical Activity: Not on file  Stress: Not on file  Social Connections: Not on file  Intimate Partner Violence: Not on file    Review of Systems  Constitutional:  Positive for malaise/fatigue. Negative for chills, fever and weight loss.  Eyes:  Negative for blurred  vision, double vision and photophobia.  Respiratory:  Negative for shortness of breath.   Cardiovascular:  Negative for chest pain, palpitations and leg swelling.  Gastrointestinal:  Negative for abdominal pain, constipation, diarrhea, nausea and vomiting.  Genitourinary:  Negative for dysuria, frequency and urgency.  Neurological:  Positive for headaches. Negative for dizziness, sensory change and speech change.  Psychiatric/Behavioral:  Negative for depression, memory loss and substance abuse. The patient is not nervous/anxious and does not have insomnia.         Objective    BP 122/82   Pulse 80   Temp 97.6 F (36.4 C) (Temporal)   Ht 5\' 7"  (1.702 m)   Wt 296 lb (134.3 kg)   SpO2 98%   BMI 46.36 kg/m   Physical Exam Constitutional:      General: She is not in acute distress.    Appearance: She is not ill-appearing.  HENT:     Mouth/Throat:     Mouth: Mucous membranes are moist.     Pharynx: Oropharynx is clear.  Eyes:     Extraocular Movements: Extraocular movements intact.     Conjunctiva/sclera: Conjunctivae normal.  Cardiovascular:     Rate and Rhythm: Normal rate and regular rhythm.  Pulmonary:     Effort: Pulmonary effort is normal.     Breath sounds: Normal breath sounds.  Musculoskeletal:     Cervical back: Normal range of motion and neck supple.     Right lower leg: No edema.     Left lower leg: No edema.  Skin:    General: Skin is warm and dry.     Findings: No rash.  Neurological:     General: No focal deficit present.     Mental Status: She is alert and oriented to person, place, and time.     Cranial Nerves: No cranial nerve deficit.     Motor: No weakness.     Coordination: Coordination normal.  Psychiatric:        Mood and Affect: Mood normal.        Behavior: Behavior normal.        Thought Content: Thought content normal.           Assessment & Plan:   Problem List Items Addressed This Visit       Nervous and Auditory    Non-restorative sleep    Consider screening for sleep apnea at her follow-up in 4 weeks.        Other   Brain fog    Check labs including thyroid function.      Relevant Orders   TSH   T4, free   Vitamin B12   Fatigue - Primary    Check lab to look for underlying etiology.  Consider home sleep test at her follow-up visit.      Relevant Orders   Comprehensive metabolic panel   TSH   T4, free   Vitamin B12   VITAMIN D 25 Hydroxy (Vit-D Deficiency, Fractures)   Folate   History of anaphylaxis    Unknown trigger.  Reviewed recent ED visit notes and results.  EpiPen prescribed.  Discussed the need to call 911 or go to the ED if she requires the use of an EpiPen. Referral to allergist per request.      Relevant Medications   EPINEPHrine (EPIPEN 2-PAK) 0.3 mg/0.3 mL IJ SOAJ injection   Other Relevant Orders   Ambulatory referral to Allergy   Severe obesity (BMI >= 40) (HCC)    Recommend a low sugar, low carbohydrate diet.  Further discuss at her follow-up in 4 weeks.      Relevant Orders   Hemoglobin A1c   Lipid panel   Vegetarian diet    Check labs to look for vitamin deficiencies.      Relevant Orders   Vitamin B12   Folate   Other Visit Diagnoses     Encounter to establish care       Elevated serum glucose       Relevant Orders   Hemoglobin A1c       Return in about 4 weeks (around 06/20/2023) for sleep and headaches.   Hetty Blend, NP-C

## 2023-05-25 LAB — T4, FREE: Free T4: 0.79 ng/dL (ref 0.60–1.60)

## 2023-05-25 LAB — FOLATE: Folate: 7.2 ng/mL (ref >5.9)

## 2023-05-25 LAB — VITAMIN B12: Vitamin B-12: 247 pg/mL (ref 211–911)

## 2023-05-25 LAB — TSH: TSH: 3.13 u[IU]/mL (ref 0.35–5.50)

## 2023-05-25 LAB — VITAMIN D 25 HYDROXY (VIT D DEFICIENCY, FRACTURES): VITD: 28.51 ng/mL — ABNORMAL LOW (ref 30.00–100.00)

## 2023-05-29 ENCOUNTER — Encounter: Payer: Self-pay | Admitting: Family Medicine

## 2023-05-29 DIAGNOSIS — E785 Hyperlipidemia, unspecified: Secondary | ICD-10-CM | POA: Insufficient documentation

## 2023-05-29 DIAGNOSIS — R7303 Prediabetes: Secondary | ICD-10-CM | POA: Insufficient documentation

## 2023-05-29 DIAGNOSIS — N189 Chronic kidney disease, unspecified: Secondary | ICD-10-CM | POA: Insufficient documentation

## 2023-05-29 NOTE — Progress Notes (Signed)
Her kidney function is elevated. This may be due to dehydration if she was not well hydrated at her visit but she does have a history of elevated kidney function. She should make sure she is drinking water and staying hydrated.  Her vitamin D is slightly low. Her vitamin B12 is low normal. I recommend taking a multivitamin OTC to help increase both of these.  Her blood sugars are in the prediabetes range. Cut back on sugar and carbohydrates such as potatoes, bread, pasta and rice.

## 2023-05-30 NOTE — Progress Notes (Signed)
She should cut back on foods high in saturated fats, processed foods, fried foods and increase her physical activity to lower her LDL which is the bad cholesterol.

## 2023-06-08 ENCOUNTER — Other Ambulatory Visit: Payer: Self-pay

## 2023-06-08 ENCOUNTER — Encounter: Payer: Self-pay | Admitting: Allergy

## 2023-06-08 ENCOUNTER — Ambulatory Visit (INDEPENDENT_AMBULATORY_CARE_PROVIDER_SITE_OTHER): Payer: No Typology Code available for payment source | Admitting: Allergy

## 2023-06-08 VITALS — BP 122/80 | HR 99 | Temp 98.6°F | Resp 16 | Ht 67.32 in | Wt 295.3 lb

## 2023-06-08 DIAGNOSIS — T7840XD Allergy, unspecified, subsequent encounter: Secondary | ICD-10-CM

## 2023-06-08 DIAGNOSIS — R220 Localized swelling, mass and lump, head: Secondary | ICD-10-CM | POA: Diagnosis not present

## 2023-06-08 DIAGNOSIS — L299 Pruritus, unspecified: Secondary | ICD-10-CM | POA: Diagnosis not present

## 2023-06-08 MED ORDER — EPINEPHRINE 0.3 MG/0.3ML IJ SOAJ: 0.3000 mg | INTRAMUSCULAR | 1 refills | Status: DC | PRN

## 2023-06-08 NOTE — Progress Notes (Signed)
New Patient Note  RE: Sarah Ayala MRN: [id number] DOB: September 07, 1981 Date of Office Visit: 06/08/2023  Primary care provider: Avanell Shackleton, NP-C  Chief Complaint: allergic reactions  History of present illness: Sarah Ayala is a 42 y.o. female presenting today for evaluation of anaphylaxis.   She provided a typed paper with sequence of events. On Sunday 8/18 around 4 AM she reports having full body itching and tongue swelling.  Per EMR she went to the ED on 05/20/23 for allergic reaction.  She woke up from sleep with hand and feet itching and then developed tongue swelling. In the ED vitals were stable and exam didn't reveal ay abnormalities. She was treated with benadryl injection, solumedrol, Pepcid IV, epinephrine IM.  CBC and CMP were unremarkable. She was observed and able to discharge with prednisone course.  Foods consumed Saturday 8/17 included 2 tacos with beyond meat, queso, guacamole, salsa, tomatoes and lettuce, tortilla chips and salsa, Spinella birthday cake with icing and sprinkles, vanilla and chocolate ice cream. Ingredients of the meal provided by caterer: Tortilla, lettuce, cheddar cheese, "Jack, tomato, avocado, yellow onion, Irish butter, garlic, canned chilis, jalapeno, lime, garlic pepper, salt, onion powder, pepper, garlic powder, garlic salt, only very, taco seasoning, Cajun seasoning, minced onion, cilantro, cumin, evaporated milk, almond milk, cornstarch. She had water and a mixed drink with tequila and OJ.  There was meat products at this event but states her beyond meat burger was made separately.  She does not the queso had little chunks of something she is not sure of that could have been meat.   Sunday 9/1 around 11 PM she has symptoms of mild itching of her hands, hit the back that resolved with 2 Benadryl. For dinner ~8-8:30pm that night she ate at [employer] and had spinach and artichoke dip, tortilla chips, and impossible Burger on brioche bun with  tomato, tomato, lettuce, onion and fries with ranch dressing.  Water to drink.   She states she has had bread products, salad with tomato, dairy, avocado, onion, garlic since these episodes without symptoms.    PCP did prescribe an epipen prescription but states it was going to cost $300 thus she does not have this yet and would like to send to [facility]   No change in medications, no concern for stings/bites or change in body product detergents.   She does report seasonal allergies with runny nose, itchy eyes, sneezing mostly during spring.  Will use take Claritin as needed which does help.    Review of systems: 10pt ROS negative unless noted above in HPI  Past medical history: Past Medical History:  Diagnosis Date   CKD (chronic kidney disease)    Hyperlipidemia    Prediabetes    Pregnancy induced hypertension    Preterm labor    Vitamin D deficiency     Past surgical history: Past Surgical History:  Procedure Laterality Date   BREAST REDUCTION SURGERY     BREAST SURGERY  08/2016   Breast rwsuction surgery   CESAREAN SECTION     CESAREAN SECTION N/A 11/18/2013   Procedure: CESAREAN SECTION;  Surgeon: Mitchel Honour, DO;  Location: [facility];  Service: Obstetrics;  Laterality: N/A;    Family history:  Family History  Problem Relation Age of Onset   Arthritis Mother    Depression Mother    Cancer Father    Arthritis Maternal Grandmother    Cancer Maternal Grandmother    Diabetes Maternal Grandmother  Asthma Daughter    Asthma Son    Asthma Son     Social history: Lives in a home with carpeting in the bedroom with electric heating and central cooling.  Dogs in the home.  No concern for water damage, mildew or roaches in the home.  She is a Public house manager.  Denies smoking history.    Medication List: Current Outpatient Medications  Medication Sig Dispense Refill   Cyanocobalamin (B-12 PO) Take by mouth.     levonorgestrel (MIRENA, 52 MG,) 20 MCG/DAY IUD Take by  intrauterine route.     VITAMIN D, CHOLECALCIFEROL, PO Take by mouth. D3     EPINEPHrine (EPIPEN 2-PAK) 0.3 mg/0.3 mL IJ SOAJ injection Inject 0.3 mg into the muscle as needed for anaphylaxis. 1 each 1   predniSONE (DELTASONE) 10 MG tablet Take 2 tablets (20 mg total) by mouth 2 (two) times daily with a meal. (Patient not taking: Reported on 06/08/2023) 20 tablet 0   No current facility-administered medications for this visit.    Known medication allergies: No Known Allergies   Physical examination: Blood pressure 122/80, pulse 99, temperature 98.6 F (37 C), temperature source Temporal, resp. rate 16, height 5' 7.32" (1.71 m), weight 295 lb 4.8 oz (133.9 kg), SpO2 99%, unknown if currently breastfeeding.  General: Alert, interactive, in no acute distress. HEENT: PERRLA, TMs pearly gray, turbinates non-edematous without discharge, post-pharynx non erythematous. Neck: Supple without lymphadenopathy. Lungs: Clear to auscultation without wheezing, rhonchi or rales. {no increased work of breathing. CV: Normal S1, S2 without murmurs. Abdomen: Nondistended, nontender. Skin: Warm and dry, without lesions or rashes. Extremities:  No clubbing, cyanosis or edema. Neuro:   Grossly intact.  Diagnositics/Labs:  Allergy testing: deferred due to recent reaction symptoms  Assessment and plan: Allergic reaction - itching, swelling - Should significant symptoms recur or new symptoms occur, a journal is to be kept recording any foods eaten, beverages consumed, medications taken, activities performed, and environmental conditions within a 6 hour time period prior to the onset of symptoms. For any symptoms concerning for anaphylaxis, epinephrine is to be administered and 911 is to be called immediately. - Follow emergency action plan in case of allergic reaction - Will resend epinephrine device prescription (let us know if issues with getting this filled) - For now would take antihistamine like Claritin,  Allegra, Xyzal or Zyrtec daily for now to help decrease risk of allergy cell activation while awaiting test results  Follow-up in 3-4 months or sooner if needed (if needed for skin testing pending labs)    I appreciate the opportunity to take part in [place] care. Please do not hesitate to contact me with questions.  Sincerely,   Margo Aye, MD Allergy/Immunology [facility]

## 2023-06-08 NOTE — Patient Instructions (Signed)
Allergic reaction - itching, swelling - Should significant symptoms recur or new symptoms occur, a journal is to be kept recording any foods eaten, beverages consumed, medications taken, activities performed, and environmental conditions within a 6 hour time period prior to the onset of symptoms. For any symptoms concerning for anaphylaxis, epinephrine is to be administered and 911 is to be called immediately. - Follow emergency action plan in case of allergic reaction - Will resend epinephrine device prescription (let us know if issues with getting this filled) - For now would take antihistamine like Claritin, Allegra, Xyzal or Zyrtec daily for now to help decrease risk of allergy cell activation while awaiting test results  Follow-up in 3-4 months or sooner if needed (if needed for skin testing pending labs)

## 2023-06-14 LAB — ALLERGEN, CUMIN SEED: F265-IgE Cumin: <0.1 kU/L

## 2023-06-14 LAB — ALLERGEN PEA F12: Allergen Green Pea IgE: <0.1 kU/L

## 2023-06-14 LAB — ALLERGEN, CORIANDER/CILANTRO IGE: F317-IgE Coriander/Cilantro: <0.1 kU/L

## 2023-06-14 LAB — JALAPENO PEPPER, IGE
Class Interpretation: 0
Jalapeno Pepper: <0.35 kU/L (ref <0.35)

## 2023-06-14 LAB — ALLERGEN,GRN PEPPER,PAPRIKA,F218: Paprika IgE: <0.1 kU/L

## 2023-06-14 LAB — ALPHA-GAL PANEL
Allergen Lamb IgE: <0.1 kU/L
Beef IgE: <0.1 kU/L
IgE (Immunoglobulin E), Serum: 1069 [IU]/mL — ABNORMAL HIGH (ref 6–495)
O215-IgE Alpha-Gal: <0.1 kU/L
Pork IgE: <0.1 kU/L

## 2023-06-14 LAB — ALLERGEN, OREGANO, RF283: F283-IgE Oregano: <0.1 kU/L

## 2023-06-14 LAB — ALLERGEN, WHITE POTATO,F35: Allergen Potato, White IgE: <0.1 kU/L

## 2023-06-14 LAB — ALLERGEN,CHILI PEPPER,RF279

## 2023-06-14 LAB — XANTHAN GUM IGE
Class Interpretation: 0
Xanthan Gum, IgE*: <0.35 kU/L (ref <0.35)

## 2023-06-14 LAB — F235-IGE LENTIL: Allergen Lentil IgE: 0.11 kU/L — AB

## 2023-06-14 LAB — F306-IGE LIME: Allergen Lime IgE: <0.1 kU/L

## 2023-06-14 LAB — TRYPTASE: Tryptase: 8.5 ug/L (ref 2.2–13.2)

## 2023-06-21 ENCOUNTER — Ambulatory Visit: Payer: No Typology Code available for payment source | Admitting: Family Medicine

## 2023-06-29 ENCOUNTER — Ambulatory Visit: Payer: No Typology Code available for payment source | Admitting: Allergy

## 2023-07-01 ENCOUNTER — Emergency Department (HOSPITAL_BASED_OUTPATIENT_CLINIC_OR_DEPARTMENT_OTHER)
Admission: EM | Admit: 2023-07-01 | Discharge: 2023-07-01 | Disposition: A | Discharge status: Home / Self Care | Payer: PRIVATE HEALTH INSURANCE | Attending: Emergency Medicine | Admitting: Emergency Medicine

## 2023-07-01 ENCOUNTER — Encounter (HOSPITAL_BASED_OUTPATIENT_CLINIC_OR_DEPARTMENT_OTHER): Payer: Self-pay

## 2023-07-01 ENCOUNTER — Other Ambulatory Visit: Payer: Self-pay

## 2023-07-01 DIAGNOSIS — T7840XA Allergy, unspecified, initial encounter: Secondary | ICD-10-CM | POA: Diagnosis present

## 2023-07-01 MED ORDER — METHYLPREDNISOLONE SODIUM SUCC 125 MG IJ SOLR
125.0000 mg | Freq: Once | INTRAMUSCULAR | Status: AC
  Administered 2023-07-01: 125 mg via INTRAVENOUS
  Filled 2023-07-01: qty 2

## 2023-07-01 MED ORDER — FAMOTIDINE IN NACL 20-0.9 MG/50ML-% IV SOLN
20.0000 mg | Freq: Once | INTRAVENOUS | Status: AC
  Administered 2023-07-01: 20 mg via INTRAVENOUS
  Filled 2023-07-01: qty 50

## 2023-07-01 MED ORDER — PREDNISONE 10 MG PO TABS: 20.0000 mg | ORAL_TABLET | Freq: Two times a day (BID) | ORAL | 0 refills | Status: DC

## 2023-07-01 NOTE — Discharge Instructions (Signed)
Begin taking prednisone as prescribed.  Take Benadryl 25 mg every 6 hours while awake for the next several days.  Return to the ER if symptoms significantly worsen or change.

## 2023-07-01 NOTE — ED Triage Notes (Addendum)
POV from home, A&O x 4, GCS 15, amb to room  Woke up with rash, itching and tongue swelling, epi pen @ 0508 with no improvement per pt, sts voice is getting hoarse now.

## 2023-07-01 NOTE — ED Provider Notes (Signed)
EMERGENCY DEPARTMENT AT [facility] Provider Note   CSN: [id number] Arrival date & time: 07/01/23  8657     History  Chief Complaint  Patient presents with   Allergic Reaction    Sarah Ayala is a 42 y.o. female.  Patient is a 42 year old female with history of chronic renal insufficiency, hyperlipidemia, obesity, and recent allergic reactions that have been thus far unexplained.  Patient presenting today with complaints of tongue swelling and scratchy throat.  This woke her from sleep in the night.  Patient was here with similar complaints several weeks ago and treated with steroids and epinephrine.  She has not seen an allergist, but no definitive cause has been found for these episodes.  The history is provided by the patient.       Home Medications Prior to Admission medications   Medication Sig Start Date End Date Taking? Authorizing Provider  Cyanocobalamin (B-12 PO) Take by mouth.    [provider]  EPINEPHrine (EPIPEN 2-PAK) 0.3 mg/0.3 mL IJ SOAJ injection Inject 0.3 mg into the muscle as needed for anaphylaxis. 06/08/23   Marcelyn Bruins, MD  levonorgestrel (MIRENA, 52 MG,) 20 MCG/DAY IUD Take by intrauterine route.    [provider]  predniSONE (DELTASONE) 10 MG tablet Take 2 tablets (20 mg total) by mouth 2 (two) times daily with a meal. Patient not taking: Reported on 06/08/2023 05/20/23   Geoffery Lyons, MD  VITAMIN D, CHOLECALCIFEROL, PO Take by mouth. D3    [provider]      Allergies    Patient has no known allergies.    Review of Systems   Review of Systems  All other systems reviewed and are negative.   Physical Exam Updated Vital Signs BP 133/87   Pulse 100   Temp 98.4 F (36.9 C)   Resp 20   Ht 5\' 7"  (1.702 m)   Wt 131.5 kg   SpO2 99%   BMI 45.42 kg/m  Physical Exam Vitals and nursing note reviewed.  Constitutional:      General: She is not in acute distress.    Appearance: She is  well-developed. She is not diaphoretic.  HENT:     Head: Normocephalic and atraumatic.     Mouth/Throat:     Mouth: Mucous membranes are moist.     Pharynx: No oropharyngeal exudate or posterior oropharyngeal erythema.     Comments: I am unable to appreciate any significant swelling of the tongue or oral mucosa Cardiovascular:     Rate and Rhythm: Normal rate and regular rhythm.     Heart sounds: No murmur heard.    No friction rub. No gallop.  Pulmonary:     Effort: Pulmonary effort is normal. No respiratory distress.     Breath sounds: Normal breath sounds. No wheezing.  Abdominal:     General: Bowel sounds are normal. There is no distension.     Palpations: Abdomen is soft.     Tenderness: There is no abdominal tenderness.  Musculoskeletal:        General: Normal range of motion.     Cervical back: Normal range of motion and neck supple.  Skin:    General: Skin is warm and dry.  Neurological:     General: No focal deficit present.     Mental Status: She is alert and oriented to person, place, and time.     ED Results / Procedures / Treatments   Labs (all labs ordered are listed,  but only abnormal results are displayed) Labs Reviewed - No data to display  EKG None  Radiology No results found.  Procedures Procedures    Medications Ordered in ED Medications  methylPREDNISolone sodium succinate (SOLU-MEDROL) 125 mg/2 mL injection 125 mg (has no administration in time range)  famotidine (PEPCID) IVPB 20 mg premix (has no administration in time range)    ED Course/ Medical Decision Making/ A&P  Patient presenting with complaints of tongue swelling and scratchy throat.  She has history of allergies to unknown triggers.  She has been seeing an allergist, however no definitive cause has been found.  Patient presents again tonight with these symptoms.  She arrives here with stable vital signs and is afebrile.  Physical examination basically unremarkable except for mild  tongue swelling.  Patient has been given Solu-Medrol and Pepcid here in the ER and is feeling much better.  She took her EpiPen at home along with Benadryl prior to coming here.  Patient will be discharged with another course of steroids and continued use of Benadryl.  Final Clinical Impression(s) / ED Diagnoses Final diagnoses:  None    Rx / DC Orders ED Discharge Orders     None         Geoffery Lyons, MD 07/01/23 [phone]

## 2023-07-11 ENCOUNTER — Encounter: Payer: Self-pay | Admitting: Family Medicine

## 2023-07-11 ENCOUNTER — Ambulatory Visit (INDEPENDENT_AMBULATORY_CARE_PROVIDER_SITE_OTHER): Payer: No Typology Code available for payment source | Admitting: Family Medicine

## 2023-07-11 VITALS — BP 112/76 | HR 94 | Temp 97.6°F | Ht 67.0 in | Wt 296.0 lb

## 2023-07-11 DIAGNOSIS — E7800 Pure hypercholesterolemia, unspecified: Secondary | ICD-10-CM

## 2023-07-11 DIAGNOSIS — R7303 Prediabetes: Secondary | ICD-10-CM

## 2023-07-11 DIAGNOSIS — G44229 Chronic tension-type headache, not intractable: Secondary | ICD-10-CM | POA: Insufficient documentation

## 2023-07-11 DIAGNOSIS — N1831 Chronic kidney disease, stage 3a: Secondary | ICD-10-CM

## 2023-07-11 NOTE — Assessment & Plan Note (Signed)
Recommend a low sugar, low carbohydrate diet.  Continue exercise. Discussed options including referral to [facility] and GLP-1. She will let me know.

## 2023-07-11 NOTE — Assessment & Plan Note (Signed)
Discussed low fat diet and exercise.  

## 2023-07-11 NOTE — Patient Instructions (Signed)
Please follow-up for labs in the next few days.  Stay well-hydrated.  Try to limit Excedrin and other over-the-counter pain relievers except Tylenol.  Keep a headache journal.  Keep up the good work with exercise.  Limit sugar and carbohydrates such as potatoes, bread, pasta and rice.

## 2023-07-11 NOTE — Progress Notes (Signed)
Subjective:     Patient ID: Sarah Ayala, female    DOB: 08/02/1981, 42 y.o.   MRN: [id number]  Chief Complaint  Patient presents with   Medical Management of Chronic Issues    4 week f/u for sleep and headaches    HPI  History of Present Illness         Here to follow up on headaches, fatigue, and abnormal labs.   C/o headaches 3 days per week. Long hx of the same. Random times. Bilateral temples.  No neck pain.  Denies any vision changes. No numbness, tingling or weakness. No N/V.  Excedrin for headaches some days.   Her energy has improved.  Does not think she has sleep apnea. No snoring or apnea per her husband. More restful sleep recently.   Walking a mile 4 days per week.   CKD follow up as well as vitamin D and B12.   Health Maintenance Due  Topic Date Due   Hepatitis C Screening  Never done   Cervical Cancer Screening (HPV/Pap Cotest)  12/25/2015   INFLUENZA VACCINE  Never done   COVID-19 Vaccine (1 - 2023-24 season) Never done    Past Medical History:  Diagnosis Date   CKD (chronic kidney disease)    Hyperlipidemia    Prediabetes    Pregnancy induced hypertension    Preterm labor    Vitamin D deficiency     Past Surgical History:  Procedure Laterality Date   BREAST REDUCTION SURGERY     BREAST SURGERY  08/2016   Breast rwsuction surgery   CESAREAN SECTION     CESAREAN SECTION N/A 11/18/2013   Procedure: CESAREAN SECTION;  Surgeon: Mitchel Honour, DO;  Location: [facility];  Service: Obstetrics;  Laterality: N/A;    Family History  Problem Relation Age of Onset   Arthritis Mother    Depression Mother    Cancer Father    Arthritis Maternal Grandmother    Cancer Maternal Grandmother    Diabetes Maternal Grandmother    Asthma Daughter    Asthma Son    Asthma Son     Social History   Socioeconomic History   Marital status: Married    Spouse name: Not on file   Number of children: Not on file   Years of education: Not on file   Highest  education level: Associate degree: occupational, Scientist, product/process development, or vocational program  Occupational History   Not on file  Tobacco Use   Smoking status: Never    Passive exposure: Current   Smokeless tobacco: Never  Substance and Sexual Activity   Alcohol use: Yes    Alcohol/week: 3.0 standard drinks of alcohol    Types: 3 Glasses of wine per week    Comment: occasionally   Drug use: No   Sexual activity: Yes    Birth control/protection: I.U.D., None    Comment: Mirena 2020  Other Topics Concern   Not on file  Social History Narrative   Not on file   Social Determinants of Health   Financial Resource Strain: Low Risk  (07/10/2023)   Overall Financial Resource Strain (CARDIA)    Difficulty of Paying Living Expenses: Not hard at all  Food Insecurity: No Food Insecurity (07/10/2023)   Hunger Vital Sign    Worried About Running Out of Food in the Last Year: Never true    Ran Out of Food in the Last Year: Never true  Transportation Needs: No Transportation Needs (07/10/2023)   PRAPARE -  Administrator, Civil Service (Medical): No    Lack of Transportation (Non-Medical): No  Physical Activity: Insufficiently Active (07/10/2023)   Exercise Vital Sign    Days of Exercise per Week: 2 days    Minutes of Exercise per Session: 30 min  Stress: No Stress Concern Present (07/10/2023)   [employer] of Occupational Health - Occupational Stress Questionnaire    Feeling of Stress : Not at all  Social Connections: Socially Integrated (07/10/2023)   Social Connection and Isolation Panel [NHANES]    Frequency of Communication with Friends and Family: More than three times a week    Frequency of Social Gatherings with Friends and Family: More than three times a week    Attends Religious Services: More than 4 times per year    Active Member of [employer] or Organizations: Yes    Attends Engineer, structural: More than 4 times per year    Marital Status: Married  Catering manager  Violence: Not on file    Outpatient Medications Prior to Visit  Medication Sig Dispense Refill   Cyanocobalamin (B-12 PO) Take by mouth.     EPINEPHrine (EPIPEN 2-PAK) 0.3 mg/0.3 mL IJ SOAJ injection Inject 0.3 mg into the muscle as needed for anaphylaxis. 1 each 1   levonorgestrel (MIRENA, 52 MG,) 20 MCG/DAY IUD Take by intrauterine route.     VITAMIN D, CHOLECALCIFEROL, PO Take by mouth. D3     predniSONE (DELTASONE) 10 MG tablet Take 2 tablets (20 mg total) by mouth 2 (two) times daily with a meal. (Patient not taking: Reported on 07/11/2023) 20 tablet 0   No facility-administered medications prior to visit.    No Known Allergies  Review of Systems  Constitutional:  Negative for chills and fever.  Eyes:  Negative for blurred vision, double vision, photophobia and pain.  Respiratory:  Negative for shortness of breath.   Cardiovascular:  Negative for chest pain, palpitations and leg swelling.  Gastrointestinal:  Negative for abdominal pain, constipation, diarrhea, nausea and vomiting.  Genitourinary:  Negative for dysuria, frequency and urgency.  Musculoskeletal:  Negative for neck pain.  Neurological:  Positive for headaches. Negative for dizziness, tingling and focal weakness.       Objective:    Physical Exam Constitutional:      General: She is not in acute distress.    Appearance: She is not ill-appearing.  Eyes:     Extraocular Movements: Extraocular movements intact.     Conjunctiva/sclera: Conjunctivae normal.  Cardiovascular:     Rate and Rhythm: Normal rate.  Pulmonary:     Effort: Pulmonary effort is normal.  Musculoskeletal:     Cervical back: Normal range of motion and neck supple.  Skin:    General: Skin is warm and dry.  Neurological:     General: No focal deficit present.     Mental Status: She is alert and oriented to person, place, and time.  Psychiatric:        Mood and Affect: Mood normal.        Behavior: Behavior normal.        Thought Content:  Thought content normal.      BP 112/76 (BP Location: Left Arm, Patient Position: Sitting, Cuff Size: Large)   Pulse 94   Temp 97.6 F (36.4 C) (Temporal)   Ht 5\' 7"  (1.702 m)   Wt 296 lb (134.3 kg)   SpO2 99%   BMI 46.36 kg/m  Wt Readings from Last 3 Encounters:  07/11/23 296 lb (134.3 kg)  07/01/23 290 lb (131.5 kg)  06/08/23 295 lb 4.8 oz (133.9 kg)       Assessment & Plan:   Problem List Items Addressed This Visit       Genitourinary   CKD (chronic kidney disease) - Primary    Discussed limiting NSAIDs and staying hydrated. Hx of pre-eclampsia. Her BP is normal. She will return well hydrated and to recheck renal function. Consider referral to nephrology if worsening.       Relevant Orders   Basic metabolic panel     Other   Chronic tension-type headache, not intractable    Recommend keeping a headache journal. Pay attention to see if she is clenching her teeth or grinding at nighttime. Discussed getting an eye exam and keeping neck tension under control. Limit Excedrin use. Stay hydrated. Get regular sleep and do not skip meals. Follow up if headaches are not improving or if worsening.       Hyperlipidemia    Discussed low fat diet and exercise.       Prediabetes    Counseling on diet and exercise.       Severe obesity (BMI >= 40) (HCC)    Recommend a low sugar, low carbohydrate diet.  Continue exercise. Discussed options including referral to [facility] and GLP-1. She will let me know.        I have discontinued Elina L. Lorusso's predniSONE. I am also having her maintain her Mirena (52 MG), (VITAMIN D, CHOLECALCIFEROL, PO), Cyanocobalamin (B-12 PO), and EPINEPHrine.  No orders of the defined types were placed in this encounter.

## 2023-07-11 NOTE — Assessment & Plan Note (Signed)
Recommend keeping a headache journal. Pay attention to see if she is clenching her teeth or grinding at nighttime. Discussed getting an eye exam and keeping neck tension under control. Limit Excedrin use. Stay hydrated. Get regular sleep and do not skip meals. Follow up if headaches are not improving or if worsening.

## 2023-07-11 NOTE — Assessment & Plan Note (Signed)
Discussed limiting NSAIDs and staying hydrated. Hx of pre-eclampsia. Her BP is normal. She will return well hydrated and to recheck renal function. Consider referral to nephrology if worsening.

## 2023-07-11 NOTE — Assessment & Plan Note (Signed)
Counseling on diet and exercise

## 2023-07-16 LAB — BASIC METABOLIC PANEL
BUN: 8 mg/dL (ref 6–23)
CO2: 25 meq/L (ref 19–32)
Calcium: 9.2 mg/dL (ref 8.4–10.5)
Chloride: 104 meq/L (ref 96–112)
Creatinine, Ser: 1.18 mg/dL (ref 0.40–1.20)
GFR: 57.41 mL/min — ABNORMAL LOW (ref >60.00)
Glucose, Bld: 104 mg/dL — ABNORMAL HIGH (ref 70–99)
Potassium: 3.8 meq/L (ref 3.5–5.1)
Sodium: 137 meq/L (ref 135–145)

## 2023-09-07 ENCOUNTER — Ambulatory Visit (INDEPENDENT_AMBULATORY_CARE_PROVIDER_SITE_OTHER): Payer: No Typology Code available for payment source | Admitting: Allergy

## 2023-09-07 ENCOUNTER — Encounter: Payer: Self-pay | Admitting: Allergy

## 2023-09-07 ENCOUNTER — Other Ambulatory Visit: Payer: Self-pay

## 2023-09-07 VITALS — BP 118/76 | HR 90 | Temp 97.6°F | Resp 12

## 2023-09-07 DIAGNOSIS — R220 Localized swelling, mass and lump, head: Secondary | ICD-10-CM | POA: Diagnosis not present

## 2023-09-07 DIAGNOSIS — L299 Pruritus, unspecified: Secondary | ICD-10-CM | POA: Diagnosis not present

## 2023-09-07 DIAGNOSIS — T7840XD Allergy, unspecified, subsequent encounter: Secondary | ICD-10-CM

## 2023-09-07 MED ORDER — EPINEPHRINE 0.3 MG/0.3ML IJ SOAJ
0.3000 mg | INTRAMUSCULAR | 1 refills | Status: DC | PRN
Start: 2023-09-07 — End: 2023-09-21

## 2023-09-07 NOTE — Progress Notes (Signed)
Follow-up Note  RE: Sarah Ayala MRN: [id number] DOB: 1981-02-25 Date of Office Visit: 09/07/2023   History of present illness: Sarah Ayala is a 42 y.o. female presenting today for follow-up of allergic reaction.  She was last seen in the office on 06/08/23 by myself.    Discussed the use of AI scribe software for clinical note transcription with the patient, who gave verbal consent to proceed.  She presents with another episode of allergic reaction. She reports two significant episodes, both occurring after consuming food products containing beef. The first episode she states she found out that the cheese dip she did contain contain beef. The second episode occurred at the end of September after the last visit after consuming a store-bought pizza with various toppings, including beef, which she  removed before consumption. Both episodes resulted in the patient waking up around the middle of the night with symptoms of an allergic reaction involving itching and swelling of especially lip tongue swelling.  With the second episode she had access to an epinephrine device which she did use.  She does feel like the second episode was milder than the first episode but states it may be because she, of knew what to expect it did not seem quite as severe.  She did go to the ED on July 01, 2023 where they did give her a Solu-Medrol dose and Pepcid IV.    Review of systems: 10pt ROS negative unless noted above in HPI   All other systems negative unless noted above in HPI  Past medical/social/surgical/family history have been reviewed and are unchanged unless specifically indicated below.  No changes  Medication List: Current Outpatient Medications  Medication Sig Dispense Refill   Cyanocobalamin (B-12 PO) Take by mouth.     EPINEPHrine (EPIPEN 2-PAK) 0.3 mg/0.3 mL IJ SOAJ injection Inject 0.3 mg into the muscle as needed for anaphylaxis. 1 each 1   levonorgestrel (MIRENA, 52 MG,) 20  MCG/DAY IUD Take by intrauterine route.     VITAMIN D, CHOLECALCIFEROL, PO Take by mouth. D3     No current facility-administered medications for this visit.     Known medication allergies: No Known Allergies   Physical examination: Blood pressure 118/76, pulse 90, temperature 97.6 F (36.4 C), temperature source Temporal, resp. rate 12, SpO2 100%, unknown if currently breastfeeding.  General: Alert, interactive, in no acute distress. HEENT: PERRLA, TMs pearly gray, turbinates non-edematous without discharge, post-pharynx non erythematous. Neck: Supple without lymphadenopathy. Lungs: Clear to auscultation without wheezing, rhonchi or rales. {no increased work of breathing. CV: Normal S1, S2 without murmurs. Abdomen: Nondistended, nontender. Skin: Warm and dry, without lesions or rashes. Extremities:  No clubbing, cyanosis or edema. Neuro:   Grossly intact.  Diagnositics/Labs: Labs:  Component     Latest Ref Rng 06/08/2023  IgE (Immunoglobulin E), Serum     6 - 495 IU/mL 1,069 (H)   Pork IgE     Class 0 kU/L <0.10   Beef IgE     Class 0 kU/L <0.10   Allergen Lamb IgE     Class 0 kU/L <0.10   O215-IgE Alpha-Gal     Class 0 kU/L <0.10   Xanthan Gum, IgE*     <0.35 kU/L <0.35   Class Interpretation 0   Jalapeno Pepper     <0.35 kU/L <0.35   Class Interpretation 0   Tryptase     2.2 - 13.2 ug/L 8.5   Allergen Potato, White IgE  Class 0 kU/L <0.10   Allergen Green Pea IgE     Class 0 kU/L <0.10   Allergen Lentil IgE     Class 0/I kU/L 0.11 !   Allergen Lime IgE     Class 0 kU/L <0.10   F279-IgE Chili Pepper     kU/L CANCELED   Paprika IgE     Class 0 kU/L <0.10   F265-IgE Cumin     Class 0 kU/L <0.10   F283-IgE Oregano     Class 0 kU/L <0.10   Coriander/Cilantro     Class 0 kU/L <0.10     Assessment and plan:   Allergic reaction - itching, swelling - recurrence of symptoms that seemingly seems triggered by red meat product despite alpha gal IgE being  negative.   Would do your best to avoid red meat products at this time while undergoing further testing.  - recommend skin testing for foods of concern - discussed the following can decrease threshold for reactions: illness, exercise, NSAIDs (ie ibuprofen/aspirin family), opiate pain medications, alcoholic beverages - Should significant symptoms recur or new symptoms occur, a journal is to be kept recording any foods eaten, beverages consumed, medications taken, activities performed, and environmental conditions within a 6 hour time period prior to the onset of symptoms. For any symptoms concerning for anaphylaxis, epinephrine is to be administered and 911 is to be called immediately. - Follow emergency action plan in case of allergic reaction - Have access to epinephrine device (Epipen 0.3mg ) - For now would take antihistamine like Allegra, Xyzal or Zyrtec daily for now to help decrease risk of allergy cell activation while awaiting test results - Have access to Pepcid 20mg  at home that can be taken with antihistamine for more complete antihistamine blockade  Schedule skin testing visit and hold antihistamines for 3 days prior to this visit See food testing sheet at front desk  I appreciate the opportunity to take part in [place] care. Please do not hesitate to contact me with questions.  Sincerely,   Margo Aye, MD Allergy/Immunology [facility]

## 2023-09-07 NOTE — Patient Instructions (Addendum)
Allergic reaction - itching, swelling - recurrence of symptoms that seemingly seems triggered by red meat product despite alpha gal IgE being negative.   Would do your best to avoid red meat products at this time while undergoing further testing.  - recommend skin testing for foods of concern - discussed the following can decrease threshold for reactions: illness, exercise, NSAIDs (ie ibuprofen/aspirin family), opiate pain medications, alcoholic beverages - Should significant symptoms recur or new symptoms occur, a journal is to be kept recording any foods eaten, beverages consumed, medications taken, activities performed, and environmental conditions within a 6 hour time period prior to the onset of symptoms. For any symptoms concerning for anaphylaxis, epinephrine is to be administered and 911 is to be called immediately. - Follow emergency action plan in case of allergic reaction - Have access to epinephrine device (Epipen 0.3mg ) - For now would take antihistamine like Allegra, Xyzal or Zyrtec daily for now to help decrease risk of allergy cell activation while awaiting test results - Have access to Pepcid 20mg  at home that can be taken with antihistamine for more complete antihistamine blockade  Schedule skin testing visit and hold antihistamines for 3 days prior to this visit

## 2023-09-08 IMAGING — CT CT ANGIO CHEST
2 of 7 series · 17 of 46 positions shown · IV contrast (omnipaque)
Comparison: Chest radiograph dated 09/24/2021.

CLINICAL DATA: Concern for pulmonary embolism.

EXAM:
CT ANGIOGRAPHY CHEST WITH CONTRAST
TECHNIQUE: Multidetector CT imaging of the chest was performed using the
standard protocol during bolus administration of intravenous
contrast. Multiplanar CT image reconstructions and MIPs were
obtained to evaluate the vascular anatomy.
CONTRAST:  63mL OMNIPAQUE IOHEXOL 350 MG/ML SOLN

[Series 5: pe axial thins · axial · 0.70mm/px · z∈[+987,+1194]mm · 14 of 239 slices shown]
[im 16/239  lung]
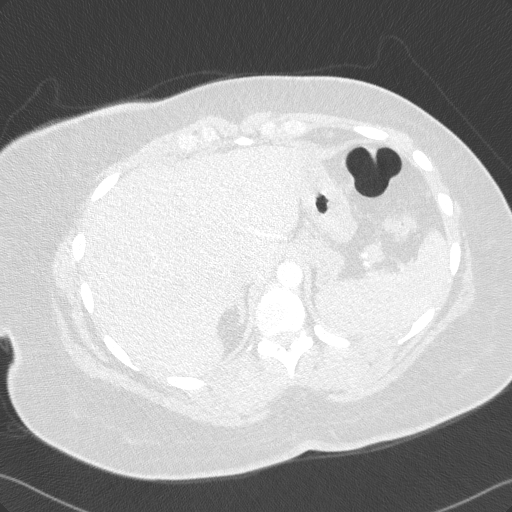
[im 32/239  soft-tissue]
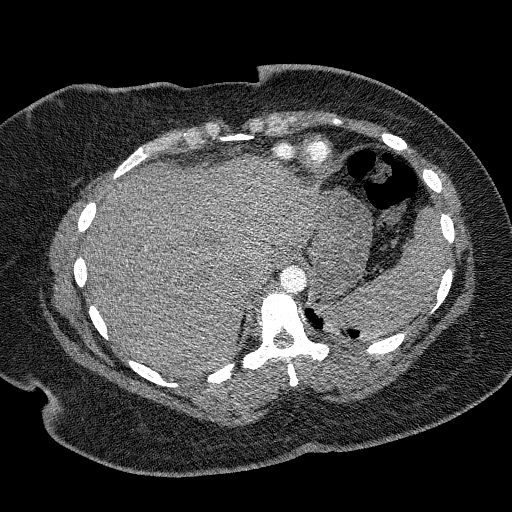
[im 48/239  lung]
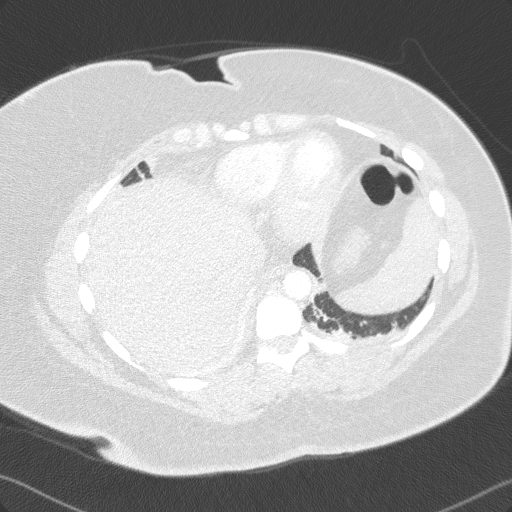
[im 64/239  soft-tissue]
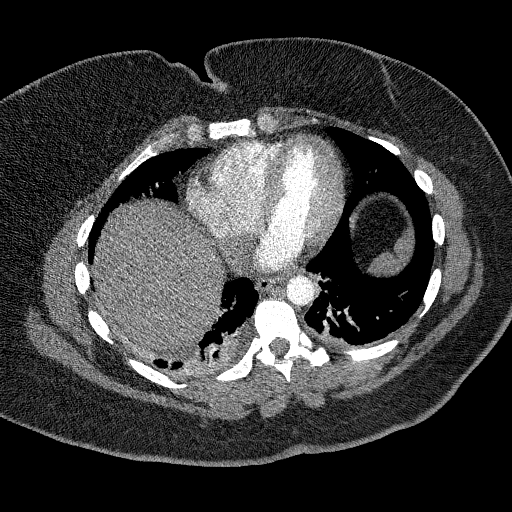
[im 80/239  lung]
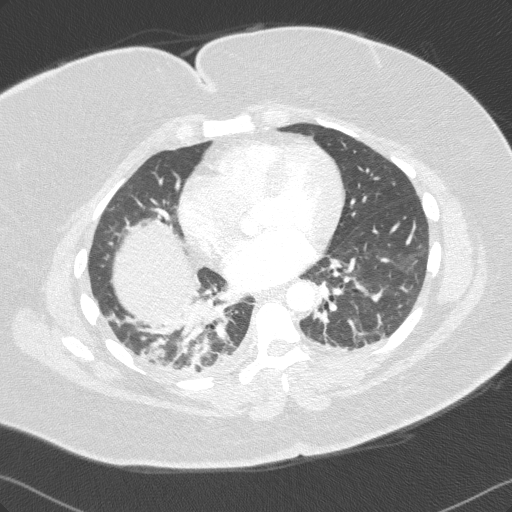
[im 96/239  soft-tissue]
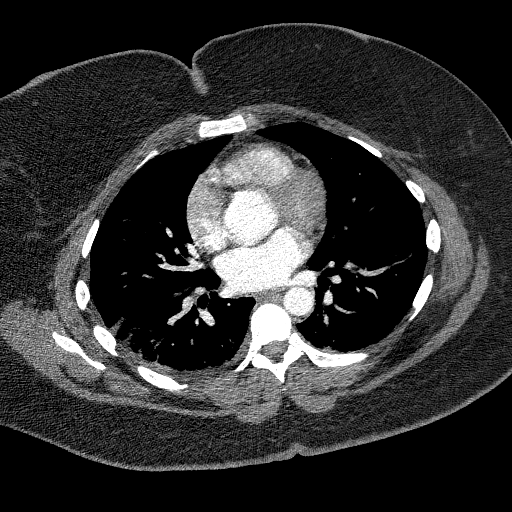
[im 112/239  lung]
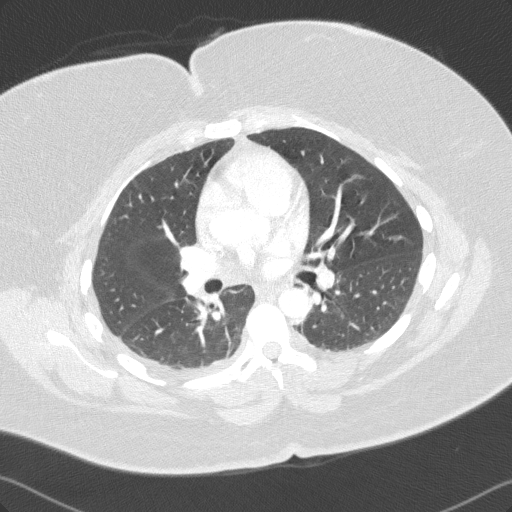
[im 127/239  soft-tissue]
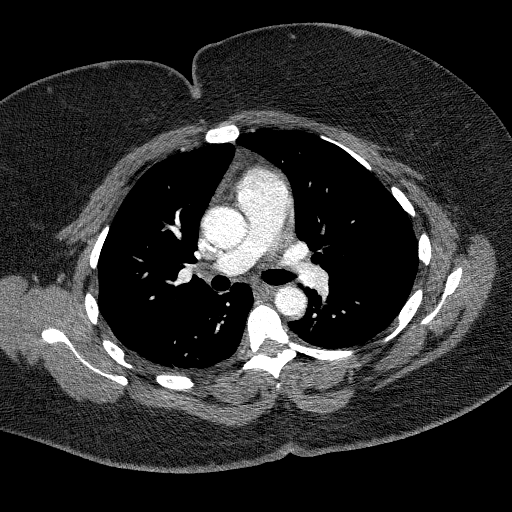
[im 143/239  lung]
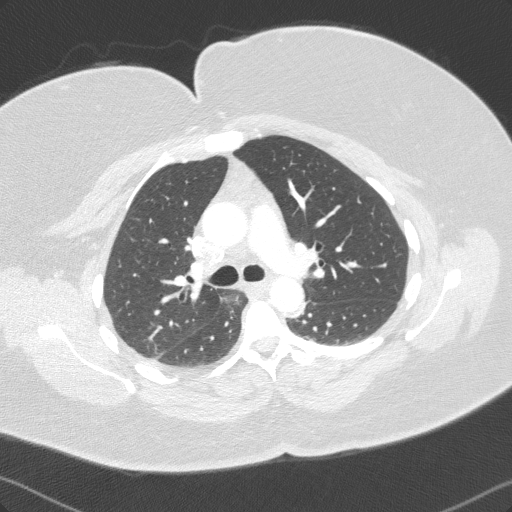
[im 159/239  soft-tissue]
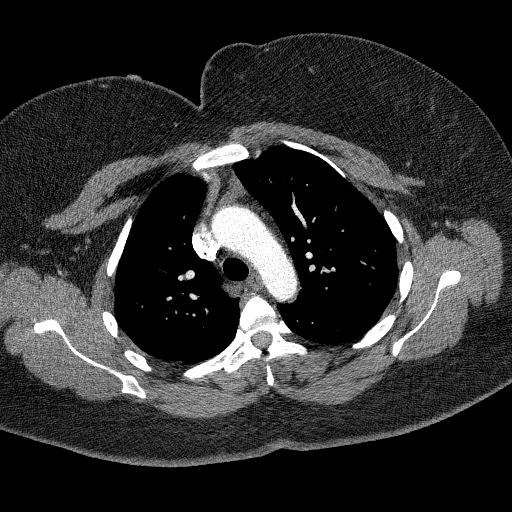
[im 175/239  lung]
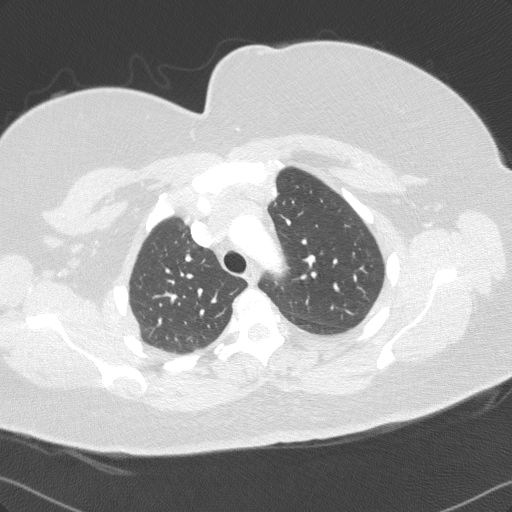
[im 191/239  soft-tissue]
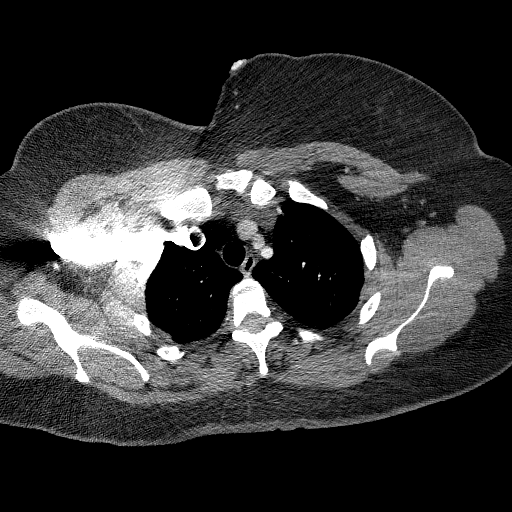
[im 207/239  lung]
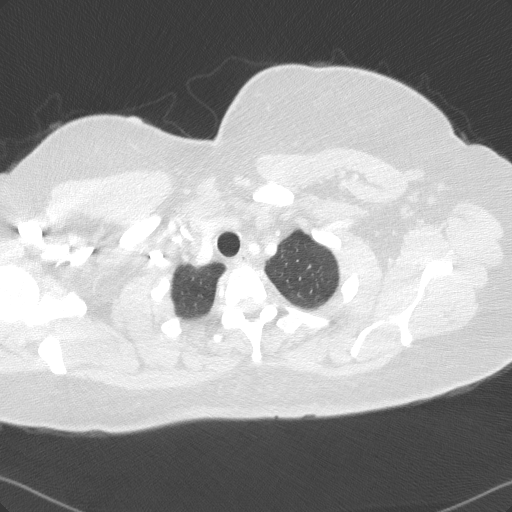
[im 223/239  soft-tissue]
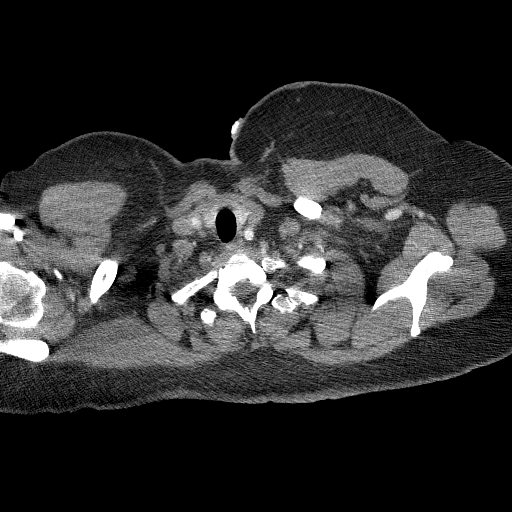

[Series 7: cor soft · coronal · 0.54mm/px · 3 of 155 slices shown]
[im 39/155  soft-tissue]
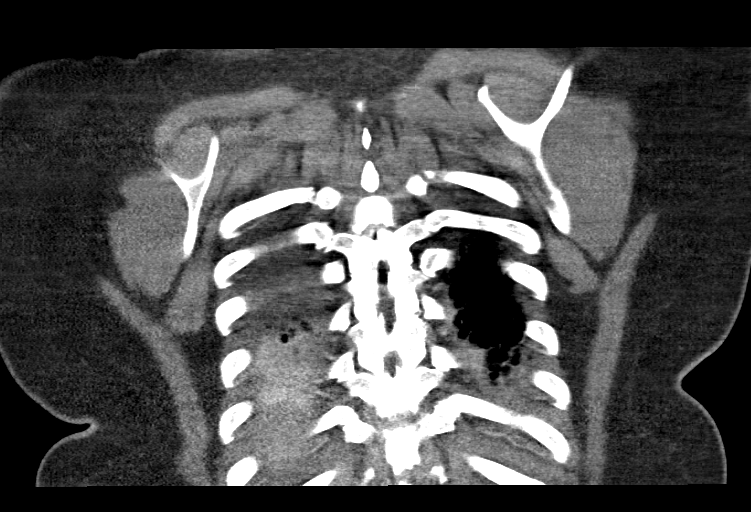
[im 78/155  soft-tissue]
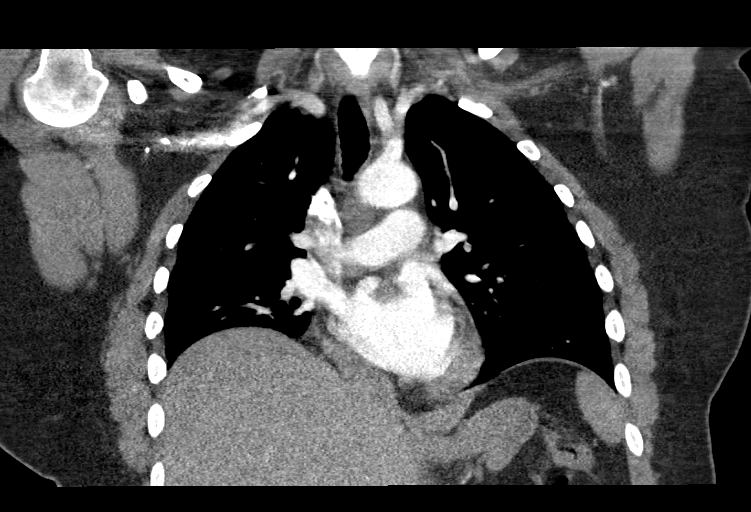
[im 116/155  soft-tissue]
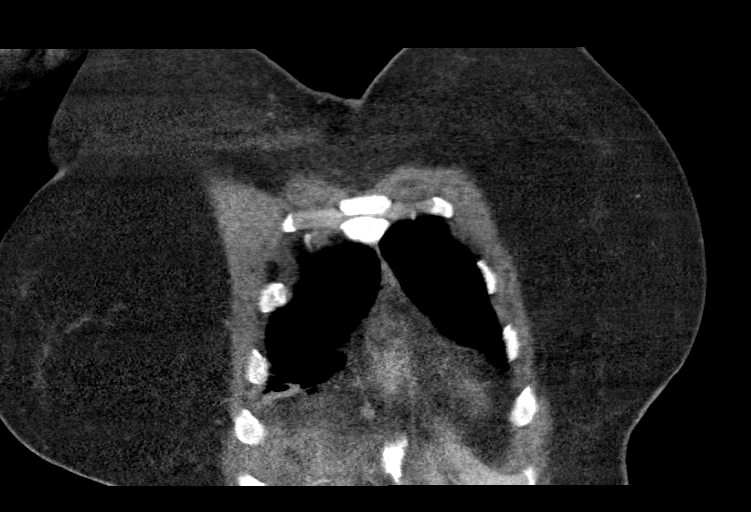

[17 of 46 positions shown; findings below may reference images not displayed]

FINDINGS: Cardiovascular: No cardiomegaly or pericardial effusion. The
thoracic aorta is unremarkable. The origins of the great vessels of
the aortic arch appear patent. Evaluation of the pulmonary arteries
is limited due to suboptimal opacification and timing of the
contrast. No obvious large or central pulmonary artery embolus
identified.

Mediastinum/Nodes: No hilar or mediastinal adenopathy. The esophagus
is grossly unremarkable. No mediastinal fluid collection.

Lungs/Pleura: Trace right pleural effusion. Bibasilar linear and
streaky densities, likely atelectasis. Infiltrate is not excluded.
No pneumothorax. The central airways are patent.

Upper Abdomen: No acute abnormality.

Musculoskeletal: No chest wall abnormality. No acute or significant
osseous findings.

Review of the MIP images confirms the above findings.
IMPRESSION: 1. No CT evidence of central pulmonary artery embolus.
2. Trace right pleural effusion with bibasilar linear and streaky
densities, likely atelectasis. Infiltrate is not excluded.

## 2023-09-20 ENCOUNTER — Other Ambulatory Visit: Payer: Self-pay

## 2023-09-20 ENCOUNTER — Emergency Department (HOSPITAL_BASED_OUTPATIENT_CLINIC_OR_DEPARTMENT_OTHER)
Admission: EM | Admit: 2023-09-20 | Discharge: 2023-09-21 | Disposition: A | Discharge status: Home / Self Care | Payer: PRIVATE HEALTH INSURANCE | Attending: Emergency Medicine | Admitting: Emergency Medicine

## 2023-09-20 ENCOUNTER — Encounter (HOSPITAL_BASED_OUTPATIENT_CLINIC_OR_DEPARTMENT_OTHER): Payer: Self-pay

## 2023-09-20 DIAGNOSIS — R000 Tachycardia, unspecified: Secondary | ICD-10-CM | POA: Insufficient documentation

## 2023-09-20 DIAGNOSIS — T7840XA Allergy, unspecified, initial encounter: Secondary | ICD-10-CM | POA: Diagnosis present

## 2023-09-20 DIAGNOSIS — T7840XD Allergy, unspecified, subsequent encounter: Secondary | ICD-10-CM

## 2023-09-20 DIAGNOSIS — R220 Localized swelling, mass and lump, head: Secondary | ICD-10-CM

## 2023-09-20 LAB — PREGNANCY, URINE: Preg Test, Ur: NEGATIVE

## 2023-09-20 NOTE — ED Triage Notes (Signed)
Pt started having swelling of tongue at 2300.Pt stated its getting harder to swallow. Pt is itching. Used epi pen twice with no relief. Pt does not know what she is allergic to.

## 2023-09-21 ENCOUNTER — Encounter: Payer: Self-pay | Admitting: Allergy

## 2023-09-21 MED ORDER — FAMOTIDINE IN NACL 20-0.9 MG/50ML-% IV SOLN
20.0000 mg | Freq: Once | INTRAVENOUS | Status: AC
  Administered 2023-09-21: 20 mg via INTRAVENOUS
  Filled 2023-09-21: qty 50

## 2023-09-21 MED ORDER — METHYLPREDNISOLONE SODIUM SUCC 125 MG IJ SOLR
125.0000 mg | Freq: Once | INTRAMUSCULAR | Status: AC
  Administered 2023-09-21: 125 mg via INTRAVENOUS
  Filled 2023-09-21: qty 2

## 2023-09-21 MED ORDER — PREDNISONE 10 MG (21) PO TBPK: ORAL_TABLET | Freq: Every day | ORAL | 0 refills | Status: DC

## 2023-09-21 MED ORDER — EPINEPHRINE 0.3 MG/0.3ML IJ SOAJ: 0.3000 mg | INTRAMUSCULAR | 1 refills | Status: AC | PRN

## 2023-09-21 NOTE — ED Provider Notes (Signed)
[place] EMERGENCY DEPARTMENT AT [facility] Provider Note   CSN: [id number] Arrival date & time: 09/20/23  2326     History {Add pertinent medical, surgical, social history, OB history to HPI:1} Chief Complaint  Patient presents with   Allergic Reaction    Sarah Ayala is a 42 y.o. female.  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  Patient states that she was in bed but started feeling her palms itching which is how previous reactions that started.  She started having some upset stomach and subsequent loose stools and is starting her tongue swelling and as when she took her epinephrine pen.  She did try Benadryl prior to that secondary to the hand itching.  It did not seem like things were getting better about 10 minutes later so she took another dose of epinephrine on the way here.  At this time she states symptoms are improving but she still feels little bit of swelling of her tongue.  On review of her records she is pending more testing as they have not exactly figured out what is causing her symptoms.  She has not noted any new allergens in her life but it does seem that it always happens at night and while she is in her bed.   Allergic Reaction      Home Medications Prior to Admission medications   Medication Sig Start Date End Date Taking? Authorizing Provider  Cyanocobalamin (B-12 PO) Take by mouth.    [provider]  EPINEPHrine (EPIPEN 2-PAK) 0.3 mg/0.3 mL IJ SOAJ injection Inject 0.3 mg into the muscle as needed for anaphylaxis. 09/07/23   Marcelyn Bruins, MD  levonorgestrel (MIRENA, 52 MG,) 20 MCG/DAY IUD Take by intrauterine route.    [provider]  VITAMIN D, CHOLECALCIFEROL, PO Take by mouth. D3    [provider]      Allergies    Patient has no known allergies.    Review of Systems   Review of Systems  Physical Exam Updated Vital  Signs BP (!) 163/92 (BP Location: Right Arm)   Pulse (!) 123   Resp 20   Ht 5\' 7"  (1.702 m)   Wt 134.3 kg   SpO2 100%   BMI 46.37 kg/m  Physical Exam Vitals and nursing note reviewed.  Constitutional:      Appearance: She is well-developed.  HENT:     Head: Normocephalic and atraumatic.  Cardiovascular:     Rate and Rhythm: Regular rhythm. Tachycardia present.  Pulmonary:     Effort: No respiratory distress.     Breath sounds: No stridor. No wheezing or rales.  Abdominal:     General: There is no distension.  Musculoskeletal:     Cervical back: Normal range of motion.  Neurological:     Mental Status: She is alert.     ED Results / Procedures / Treatments   Labs (all labs ordered are listed, but only abnormal results are displayed) Labs Reviewed  PREGNANCY, URINE    EKG None  Radiology No results found.  Procedures Procedures    Medications Ordered in ED Medications  methylPREDNISolone sodium succinate (SOLU-MEDROL) 125 mg/2 mL injection 125 mg (has no administration in time range)  famotidine (PEPCID) IVPB 20 mg premix (has no administration in time range)    ED Course/ Medical Decision Making/ A&P  Medical Decision Making Amount and/or Complexity of Data Reviewed Labs: ordered.  Risk Prescription drug management.  Has already had Benadryl and epinephrine will give steroids and Pepcid observed for couple hours.  She will need a refill for her EpiPen's.  She already has an appointment with her allergist in a couple days. ***  {Document critical care time when appropriate:1} {Document review of labs and clinical decision tools ie heart score, Chads2Vasc2 etc:1}  {Document your independent review of radiology images, and any outside records:1} {Document your discussion with family members, caretakers, and with consultants:1} {Document social determinants of health affecting pt's care:1} {Document your decision making  why or why not admission, treatments were needed:1} Final Clinical Impression(s) / ED Diagnoses Final diagnoses:  None    Rx / DC Orders ED Discharge Orders     None

## 2023-09-21 NOTE — Telephone Encounter (Addendum)
-----   Message from [facility] [place] sent at 09/21/2023  1:17 PM EST ----- Regarding: RE: ED visit Can we see how pt is doing.   I believe she got a prednisone taper from ED visit and if she has had a full days worth and she is back to normal then she does not need to complete the taper.   We do though need to change her skin testing appointment that was for 09/24/23 with this most recent reaction and need for treatment.  Recent reactions can deplete the histamine response needed for skin testing and thus ideally would like to have a 4 week window (at least 2 week minimum) between reaction/treatment and skin testing.  So would push it out no sooner than 10/08/23 week.   Will need to hold antihistamines for 3 days prior to this rescheduled visit.  If needed prednisone can be taken those 3 days off antihistamines for testing to help prevent reaction symptoms that would interfere with testing. ----- Message ----- From: Marily Memos, MD Sent: 09/21/2023   2:53 AM EST To: Marcelyn Bruins, MD Subject: ED visit                                       Dr. Delorse Lek,  I saw Sarah Ayala in the ED tonight.  She describes what sounds like another anaphylactic reaction however had mostly resolved prior to my evaluation.  On arrival here she was just having tachycardia and hypertension.  I went through the normal list of questions and the only thing that I noticed is that it seemed to always happen at night and in her bedroom so she will start doing a better job of keeping a log and then also note if there is any possible allergens there. Reason I am contacting you is that I do not normally send prescriptions for steroids based on my understanding of the most recent guidelines for anaphylaxis or allergic reactions.  However the patient said that they did the last couple times she had an episode and she seemed like she almost expected it.  I told her I would write the prescription but she needed to discuss  with you if that is what your preference was or not.  Also if you were planning on doing allergy testing soon and that you may not want them within a few days of those tests.  So I expect her to contact you today to discuss the need for them.  If you want them I sent a taper to her pharmacy.  I also sent a refill for her EpiPen's.  Thank you hope you have a happy weekend and holiday season. Sarah Ayala message to provider to advise of patient myChart message.

## 2023-09-24 ENCOUNTER — Ambulatory Visit: Payer: No Typology Code available for payment source | Admitting: Allergy

## 2023-11-07 ENCOUNTER — Ambulatory Visit (INDEPENDENT_AMBULATORY_CARE_PROVIDER_SITE_OTHER): Payer: Self-pay | Admitting: Family Medicine

## 2023-11-07 ENCOUNTER — Ambulatory Visit: Payer: Self-pay | Admitting: Family Medicine

## 2023-11-07 VITALS — BP 126/80 | HR 123 | Temp 99.4°F | Wt 300.6 lb

## 2023-11-07 DIAGNOSIS — R059 Cough, unspecified: Secondary | ICD-10-CM

## 2023-11-07 LAB — POC COVID19 BINAXNOW: SARS Coronavirus 2 Ag: NEGATIVE

## 2023-11-07 LAB — POCT INFLUENZA A/B
Influenza A, POC: NEGATIVE
Influenza B, POC: NEGATIVE

## 2023-11-07 MED ORDER — BENZONATATE 100 MG PO CAPS: 100.0000 mg | ORAL_CAPSULE | Freq: Three times a day (TID) | ORAL | 0 refills | Status: DC | PRN

## 2023-11-07 MED ORDER — AZITHROMYCIN 250 MG PO TABS: ORAL_TABLET | ORAL | 0 refills | Status: AC

## 2023-11-07 NOTE — Telephone Encounter (Signed)
 Copied from CRM [id number]. Topic: Appointments - Appointment Scheduling >> Nov 07, 2023 10:07 AM Victoria A wrote: Patient is having back aches, chills, fever, sore throat, bad cough and headaches 1-10; 6 or 7 Reason for Disposition  [1] Continuous (nonstop) coughing interferes with work or school AND [2] no improvement using cough treatment per Care Advice  Answer Assessment - Initial Assessment Questions 1. ONSET: When did the cough begin?      Yesterday  2. SEVERITY: How bad is the cough today?      Bad cough keeping patient up at night  3. SPUTUM: Describe the color of your sputum (none, dry cough; clear, white, yellow, green)     N/A  4. HEMOPTYSIS: Are you coughing up any blood? If so ask: How much? (flecks, streaks, tablespoons, etc.)     N/A  5. DIFFICULTY BREATHING: Are you having difficulty breathing? If Yes, ask: How bad is it? (e.g., mild, moderate, severe)    - MILD: No SOB at rest, mild SOB with walking, speaks normally in sentences, can lie down, no retractions, pulse < 100.    - MODERATE: SOB at rest, SOB with minimal exertion and prefers to sit, cannot lie down flat, speaks in phrases, mild retractions, audible wheezing, pulse 100-120.    - SEVERE: Very SOB at rest, speaks in single words, struggling to breathe, sitting hunched forward, retractions, pulse > 120      Normal breathing  6. FEVER: Do you have a fever? If Yes, ask: What is your temperature, how was it measured, and when did it start?     Has been feeling feverish, however temp was normal last time she checked with thermometer.  7. OTHER SYMPTOMS: Do you have any other symptoms? (e.g., runny nose, wheezing, chest pain)       Chest discomfort when coughing, sore throat when coughing, headache 4/10 pain, body aches  Protocols used: Cough - Acute Non-Productive-A-AH   Chief Complaint: Cough Symptoms: Cough, headache, body aches, chills Pertinent Negatives: Patient denies shortness of  breath Disposition: [] ED /[] Urgent Care (no appt availability in office) / [x] Appointment(In office/virtual)/ []  [facility]/ [] Home Care/ [] Refused Recommended Disposition /[] [facility]/ []  Follow-up with PCP Additional Notes: Patient stated that she is having a bad cough, headache, body aches, sore throat, chills. Her symptoms started yesterday. Her son was recently diagnosed with pneumonia and patient suspects that she now has pneumonia as well. Appointment scheduled for this afternoon.

## 2023-11-07 NOTE — Progress Notes (Signed)
 Established Patient Office Visit  Subjective   Patient ID: Sarah Ayala, female    DOB: [birth year]  Age: 43 y.o. MRN: [id number]  Chief Complaint  Patient presents with   Cough    Patient complains of cough, X1 days, Tried Tessalon  perles   Fever    Patient complains of fever, x1 day, Tried Tylenol    Generalized Body Aches    Patient complains of body aches, x1 day   Headache    Patient complains of headaches, x1 days, Tried Tylenol     HPI   Sarah Ayala is seen today with onset yesterday of low-grade fever, body aches, cough, headache, chills.  Chills have been fairly intense at times.  Her son was empirically diagnosed with possible mycoplasma.  She had leftover Tessalon  which helps somewhat with cough.  She is requesting refills.  She has taken Tylenol .  No chronic lung disease.  She does deny any significant nasal congestion at this time.  No sore throat.  Past Medical History:  Diagnosis Date   CKD (chronic kidney disease)    Hyperlipidemia    Prediabetes    Pregnancy induced hypertension    Preterm labor    Vitamin D  deficiency    Past Surgical History:  Procedure Laterality Date   BREAST REDUCTION SURGERY     BREAST SURGERY  08/2016   Breast rwsuction surgery   CESAREAN SECTION     CESAREAN SECTION N/A 11/18/2013   Procedure: CESAREAN SECTION;  Surgeon: Duwaine Blumenthal, DO;  Location: [facility];  Service: Obstetrics;  Laterality: N/A;    reports that she has never smoked. She has been exposed to tobacco smoke. She has never used smokeless tobacco. She reports current alcohol use of about 3.0 standard drinks of alcohol per week. She reports that she does not use drugs. family history includes Arthritis in her maternal grandmother and mother; Asthma in her daughter, son, and son; Cancer in her father and maternal grandmother; Depression in her mother; Diabetes in her maternal grandmother. No Known Allergies  Review of Systems  Constitutional:  Positive for chills and  fever.  HENT:  Negative for congestion and sore throat.   Respiratory:  Positive for cough.   Cardiovascular:  Negative for chest pain.  Neurological:  Positive for headaches.      Objective:     BP 126/80 (BP Location: Left Arm, Patient Position: Sitting, Cuff Size: Large)   Pulse (!) 123   Temp 99.4 F (37.4 C) (Oral)   Wt (!) 300 lb 9.6 oz (136.4 kg)   SpO2 98%   BMI 47.08 kg/m  BP Readings from Last 3 Encounters:  11/07/23 126/80  09/21/23 (!) 130/93  09/07/23 118/76   Wt Readings from Last 3 Encounters:  11/07/23 (!) 300 lb 9.6 oz (136.4 kg)  09/20/23 296 lb 1.2 oz (134.3 kg)  07/11/23 296 lb (134.3 kg)      Physical Exam Vitals reviewed.  Constitutional:      General: She is not in acute distress.    Appearance: She is not ill-appearing or toxic-appearing.  Cardiovascular:     Comments: Regular rhythm but tachycardic slightly with rate around 110 Pulmonary:     Effort: Pulmonary effort is normal. No respiratory distress.     Breath sounds: Normal breath sounds. No wheezing or rales.  Musculoskeletal:     Cervical back: Neck supple.  Lymphadenopathy:     Cervical: No cervical adenopathy.  Neurological:     Mental Status: She is alert.  Results for orders placed or performed in visit on 11/07/23  POC COVID-19  Result Value Ref Range   SARS Coronavirus 2 Ag Negative Negative  POC Influenza A/B  Result Value Ref Range   Influenza A, POC Negative Negative   Influenza B, POC Negative Negative      The 10-year ASCVD risk score (Arnett DK, et al., 2019) is: 0.8%    Assessment & Plan:   Problem List Items Addressed This Visit   None Visit Diagnoses       Cough, unspecified type    -  Primary   Relevant Orders   POC COVID-19 (Completed)   POC Influenza A/B (Completed)     COVID and influenza testing today negative.  Possible exposure to atypical pneumonia.  Given her fever, headaches, cough, and lack of nasal congestion consider possible  atypical such as mycoplasma.  We elected to go and cover with Zithromax  for 5 days.  Continue Tessalon  100 mg every 8 hours as needed for cough.  Follow-up for any increased shortness of breath or other concerns  No follow-ups on file.    Wolm Scarlet, MD

## 2023-11-07 NOTE — Patient Instructions (Signed)
 Suspect possible atypical pneumonia.    Will cover with Zithromax .

## 2023-11-28 ENCOUNTER — Ambulatory Visit (INDEPENDENT_AMBULATORY_CARE_PROVIDER_SITE_OTHER): Payer: Self-pay | Admitting: Allergy

## 2023-11-28 ENCOUNTER — Encounter: Payer: Self-pay | Admitting: Allergy

## 2023-11-28 DIAGNOSIS — R220 Localized swelling, mass and lump, head: Secondary | ICD-10-CM

## 2023-11-28 DIAGNOSIS — T7840XD Allergy, unspecified, subsequent encounter: Secondary | ICD-10-CM

## 2023-11-28 DIAGNOSIS — L299 Pruritus, unspecified: Secondary | ICD-10-CM

## 2023-11-28 NOTE — Progress Notes (Signed)
 Follow-up Note  RE: Sarah Ayala MRN: [id number] DOB: 12/12/1980 Date of Office Visit: 11/28/2023   History of present illness: Sarah Ayala is a 43 y.o. female presenting today for food allergy skin testing.  She was last seen in the office on 09/07/23 by myself.  Since then she did have an ED visit for another reaction with itching and tongue swelling that developed in the night while in bed which like a previous episode and she was treated with solumedrol and pepcid IV.  She states she did have mild symptoms another time with itching and felt like her tongue could start swelling but didn't and this occurred during the day and she had not had anything to eat prior.  She was taking Allegra and Pepcid both twice a day with this more recent episode.  She is in her usual state of health today and has held antihistamines for testing.    Medication List: Current Outpatient Medications  Medication Sig Dispense Refill   benzonatate (TESSALON PERLES) 100 MG capsule Take 1 capsule (100 mg total) by mouth 3 (three) times daily as needed. 30 capsule 0   Cyanocobalamin (B-12 PO) Take by mouth.     EPINEPHrine (EPIPEN 2-PAK) 0.3 mg/0.3 mL IJ SOAJ injection Inject 0.3 mg into the muscle as needed for anaphylaxis. 2 each 1   levonorgestrel (MIRENA, 52 MG,) 20 MCG/DAY IUD Take by intrauterine route.     predniSONE (STERAPRED UNI-PAK 21 TAB) 10 MG (21) TBPK tablet Take by mouth daily. Take 6 tabs by mouth daily  for 2 days, then 5 tabs for 2 days, then 4 tabs for 2 days, then 3 tabs for 2 days, 2 tabs for 2 days, then 1 tab by mouth daily for 2 days 42 tablet 0   VITAMIN D, CHOLECALCIFEROL, PO Take by mouth. D3     No current facility-administered medications for this visit.     Known medication allergies: No Known Allergies  Diagnositics/Labs: Labs:  Component     Latest Ref Rng 06/08/2023  IgE (Immunoglobulin E), Serum     6 - 495 IU/mL 1,069 (H)   Pork IgE     Class 0 kU/L <0.10   Beef  IgE     Class 0 kU/L <0.10   Allergen Lamb IgE     Class 0 kU/L <0.10   O215-IgE Alpha-Gal     Class 0 kU/L <0.10   Xanthan Gum, IgE*     <0.35 kU/L <0.35   Class Interpretation 0   Jalapeno Pepper     <0.35 kU/L <0.35   Class Interpretation 0   Tryptase     2.2 - 13.2 ug/L 8.5   Allergen Potato, White IgE     Class 0 kU/L <0.10   Allergen Green Pea IgE     Class 0 kU/L <0.10   Allergen Lentil IgE     Class 0/I kU/L 0.11 !   Allergen Lime IgE     Class 0 kU/L <0.10   F279-IgE Chili Pepper     kU/L CANCELED   Paprika IgE     Class 0 kU/L <0.10   F265-IgE Cumin     Class 0 kU/L <0.10   F283-IgE Oregano     Class 0 kU/L <0.10   Coriander/Cilantro     Class 0 kU/L <0.10     Allergy testing:   Food Adult Perc - 11/28/23 0800     Time Antigen Placed 2952    Allergen  Manufacturer Greer    Location Back    Number of allergen test 6     Control-buffer 50% Glycerol Negative    Control-Histamine 2+    33. [place] Meat Negative    34. Chicken Meat Negative    35. Pork Negative    36. Beef Negative             Allergy testing results were read and interpreted by provider, documented by clinical staff.   Assessment and plan:   Allergic reaction - itching, swelling - recurrence of symptoms that seemingly seems triggered by red meat product despite alpha gal IgE being negative.   Would do your best to avoid red meat products at this time.  - Skin testing today for foods of concern is negative to [place], chicken, pork beef.  - discussed the following can decrease threshold for reactions: illness, exercise, NSAIDs (ie ibuprofen/aspirin family), opiate pain medications, alcoholic beverages - Should significant symptoms recur or new symptoms occur, a journal is to be kept recording any foods eaten, beverages consumed, medications taken, activities performed, and environmental conditions within a 6 hour time period prior to the onset of symptoms. For any symptoms concerning  for anaphylaxis, epinephrine is to be administered and 911 is to be called immediately. - Follow emergency action plan in case of allergic reaction - Have access to epinephrine device (Epipen 0.3mg ) - Continue taking Allegra 1 tab twice daily with Pepcid 20mg  1 tab twice daily for more complete antihistamine response.   - Xolair monthly injections could be a great maintenance medication to decrease allergy cell activation and will see if there is a way to get this covered.   Follow-up in 4-6 months or sooner if needed  I appreciate the opportunity to take part in [place] care. Please do not hesitate to contact me with questions.  Sincerely,   Margo Aye, MD Allergy/Immunology [facility]

## 2023-11-28 NOTE — Patient Instructions (Addendum)
 Allergic reaction - itching, swelling - recurrence of symptoms that seemingly seems triggered by red meat product despite alpha gal IgE being negative.   Would do your best to avoid red meat products at this time.  - Skin testing today for foods of concern is negative to [place], chicken, pork beef.  - discussed the following can decrease threshold for reactions: illness, exercise, NSAIDs (ie ibuprofen/aspirin family), opiate pain medications, alcoholic beverages - Should significant symptoms recur or new symptoms occur, a journal is to be kept recording any foods eaten, beverages consumed, medications taken, activities performed, and environmental conditions within a 6 hour time period prior to the onset of symptoms. For any symptoms concerning for anaphylaxis, epinephrine is to be administered and 911 is to be called immediately. - Follow emergency action plan in case of allergic reaction - Have access to epinephrine device (Epipen 0.3mg ) - Continue taking Allegra 1 tab twice daily with Pepcid 20mg  1 tab twice daily for more complete antihistamine response.   - Xolair monthly injections could be a great maintenance medication to decrease allergy cell activation and will see if there is a way to get this covered.   Follow-up in 4-6 months or sooner if needed

## 2024-01-09 ENCOUNTER — Encounter: Payer: Self-pay | Admitting: Family Medicine

## 2024-01-09 ENCOUNTER — Ambulatory Visit (INDEPENDENT_AMBULATORY_CARE_PROVIDER_SITE_OTHER): Payer: PRIVATE HEALTH INSURANCE | Admitting: Family Medicine

## 2024-01-09 VITALS — BP 122/84 | HR 94 | Temp 97.6°F | Resp 18 | Ht 67.0 in | Wt 304.0 lb

## 2024-01-09 DIAGNOSIS — N1831 Chronic kidney disease, stage 3a: Secondary | ICD-10-CM | POA: Diagnosis not present

## 2024-01-09 DIAGNOSIS — Z1159 Encounter for screening for other viral diseases: Secondary | ICD-10-CM

## 2024-01-09 DIAGNOSIS — Z111 Encounter for screening for respiratory tuberculosis: Secondary | ICD-10-CM

## 2024-01-09 DIAGNOSIS — E7800 Pure hypercholesterolemia, unspecified: Secondary | ICD-10-CM

## 2024-01-09 DIAGNOSIS — Z0001 Encounter for general adult medical examination with abnormal findings: Secondary | ICD-10-CM | POA: Diagnosis not present

## 2024-01-09 DIAGNOSIS — R7303 Prediabetes: Secondary | ICD-10-CM | POA: Diagnosis not present

## 2024-01-09 LAB — COMPREHENSIVE METABOLIC PANEL WITH GFR
ALT: 18 U/L (ref 0–35)
AST: 21 U/L (ref 0–37)
Albumin: 4.1 g/dL (ref 3.5–5.2)
Alkaline Phosphatase: 75 U/L (ref 39–117)
BUN: 12 mg/dL (ref 6–23)
CO2: 28 meq/L (ref 19–32)
Calcium: 9.1 mg/dL (ref 8.4–10.5)
Chloride: 102 meq/L (ref 96–112)
Creatinine, Ser: 1.21 mg/dL — ABNORMAL HIGH (ref 0.40–1.20)
GFR: 55.52 mL/min — ABNORMAL LOW (ref >60.00)
Glucose, Bld: 105 mg/dL — ABNORMAL HIGH (ref 70–99)
Potassium: 3.7 meq/L (ref 3.5–5.1)
Sodium: 136 meq/L (ref 135–145)
Total Bilirubin: 0.5 mg/dL (ref 0.2–1.2)
Total Protein: 6.9 g/dL (ref 6.0–8.3)

## 2024-01-09 LAB — CBC WITH DIFFERENTIAL/PLATELET
Basophils Absolute: 0 10*3/uL (ref 0.0–0.1)
Basophils Relative: 0.6 % (ref 0.0–3.0)
Eosinophils Absolute: 0 10*3/uL (ref 0.0–0.7)
Eosinophils Relative: 0.8 % (ref 0.0–5.0)
HCT: 43.2 % (ref 36.0–46.0)
Hemoglobin: 14.4 g/dL (ref 12.0–15.0)
Lymphocytes Relative: 33.9 % (ref 12.0–46.0)
Lymphs Abs: 1.6 10*3/uL (ref 0.7–4.0)
MCHC: 33.3 g/dL (ref 30.0–36.0)
MCV: 96.3 fl (ref 78.0–100.0)
Monocytes Absolute: 0.5 10*3/uL (ref 0.1–1.0)
Monocytes Relative: 10.2 % (ref 3.0–12.0)
Neutro Abs: 2.5 10*3/uL (ref 1.4–7.7)
Neutrophils Relative %: 54.5 % (ref 43.0–77.0)
Platelets: 284 10*3/uL (ref 150.0–400.0)
RBC: 4.49 Mil/uL (ref 3.87–5.11)
RDW: 14.5 % (ref 11.5–15.5)
WBC: 4.6 10*3/uL (ref 4.0–10.5)

## 2024-01-09 LAB — HEMOGLOBIN A1C: Hgb A1c MFr Bld: 6.1 % (ref 4.6–6.5)

## 2024-01-09 NOTE — Progress Notes (Signed)
 Complete physical exam  Patient: Sarah Ayala   DOB: 1980/11/07   43 y.o. Female  MRN: [id number]  Subjective:    Chief Complaint  Patient presents with   Medical Management of Chronic Issues    6 month f/u, headaches a lot better.   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 Needs quantiferon test for forms   She is here for a complete physical exam. OB/GYN - April 2024   She has a form for me to fill out for her school. 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 The date shown is not the original due date.    Depression screening:    05/23/2023    8:17 AM  Depression screen PHQ 2/9  Decreased Interest 0  Down, Depressed, Hopeless 0  PHQ - 2 Score 0   Anxiety Screening:     No data to display           Patient Care Team: Avanell Shackleton, NP-C as PCP - General (Family Medicine) [facility]   Outpatient Medications Prior to Visit  Medication Sig   cetirizine (ZYRTEC) 10 MG chewable tablet Chew 10 mg by mouth 2 (two) times daily.   Cyanocobalamin (B-12 PO) Take by mouth.   EPINEPHrine (EPIPEN 2-PAK) 0.3 mg/0.3 mL IJ SOAJ injection Inject 0.3 mg into the muscle as needed for anaphylaxis.   Famotidine (PEPCID AC PO) Take by mouth 2 (two) times daily.   levonorgestrel (MIRENA, 52 MG,) 20 MCG/DAY IUD Take by intrauterine route.   VITAMIN D, CHOLECALCIFEROL, PO Take by mouth. D3   [DISCONTINUED] benzonatate (TESSALON PERLES) 100 MG capsule Take 1 capsule (100 mg total) by mouth 3 (three) times daily as needed.    [DISCONTINUED] predniSONE (STERAPRED UNI-PAK 21 TAB) 10 MG (21) TBPK tablet Take by mouth daily. Take 6 tabs by mouth daily  for 2 days, then 5 tabs for 2 days, then 4 tabs for 2 days, then 3 tabs for 2 days, 2 tabs for 2 days, then 1 tab by mouth daily for 2 days   No facility-administered medications prior to visit.    Review of Systems  Constitutional:  Negative for chills, fever, malaise/fatigue and weight loss.  HENT:  Negative for congestion, ear pain, sinus pain and sore throat.   Eyes:  Negative for blurred vision, double vision and pain.  Respiratory:  Negative for cough, shortness of breath and wheezing.   Cardiovascular:  Negative for chest pain, palpitations and leg swelling.  Gastrointestinal:  Negative for abdominal pain, constipation, diarrhea, nausea and vomiting.  Genitourinary:  Negative for dysuria, frequency and urgency.  Musculoskeletal:  Negative for back pain, joint pain and myalgias.  Skin:  Negative for rash.  Neurological:  Negative for dizziness, tingling, focal weakness and headaches.  Psychiatric/Behavioral:  Negative for depression and suicidal ideas. The patient is not nervous/anxious.        Objective:  BP 122/84 (BP Location: Left Arm, Patient Position: Sitting)   Pulse 94   Temp 97.6 F (36.4 C) (Temporal)   Resp 18   Ht 5\' 7"  (1.702 m)   Wt (!) 304 lb (137.9 kg)   SpO2 99%   BMI 47.61 kg/m  BP Readings from Last 3 Encounters:  01/09/24 122/84  11/07/23 126/80  09/21/23 (!) 130/93   Wt Readings from Last 3 Encounters:  01/09/24 (!) 304 lb (137.9 kg)  11/07/23 (!) 300 lb 9.6 oz (136.4 kg)  09/20/23 296 lb 1.2 oz (134.3 kg)    Physical Exam Constitutional:      General: She is not in acute distress.    Appearance: She is obese. She is not ill-appearing.  HENT:     Right Ear: Tympanic membrane, ear canal and external ear normal.     Left Ear: Tympanic membrane, ear canal and external ear normal.     Nose: Nose normal.      Mouth/Throat:     Mouth: Mucous membranes are moist.     Pharynx: Oropharynx is clear.  Eyes:     Extraocular Movements: Extraocular movements intact.     Conjunctiva/sclera: Conjunctivae normal.     Pupils: Pupils are equal, round, and reactive to light.  Neck:     Thyroid: No thyroid mass, thyromegaly or thyroid tenderness.  Cardiovascular:     Rate and Rhythm: Normal rate and regular rhythm.     Pulses: Normal pulses.     Heart sounds: Normal heart sounds.  Pulmonary:     Effort: Pulmonary effort is normal.     Breath sounds: Normal breath sounds.  Abdominal:     General: Bowel sounds are normal.     Palpations: Abdomen is soft.     Tenderness: There is no abdominal tenderness. There is no right CVA tenderness, left CVA tenderness, guarding or rebound.  Musculoskeletal:        General: Normal range of motion.     Cervical back: Normal range of motion and neck supple. No tenderness.     Right lower leg: No edema.     Left lower leg: No edema.  Lymphadenopathy:     Cervical: No cervical adenopathy.  Skin:    General: Skin is warm and dry.     Findings: No lesion or rash.  Neurological:     General: No focal deficit present.     Mental Status: She is alert and oriented to person, place, and time.     Cranial Nerves: No cranial nerve deficit.     Sensory: No sensory deficit.     Motor: No weakness.     Gait: Gait normal.  Psychiatric:        Mood and Affect: Mood normal.        Behavior: Behavior normal.        Thought Content: Thought content normal.      Results for orders placed or performed in visit on 01/09/24  Comprehensive metabolic panel with GFR  Result Value Ref Range   Sodium 136 135 - 145 mEq/L   Potassium 3.7 3.5 - 5.1 mEq/L   Chloride 102 96 - 112 mEq/L   CO2 28 19 - 32 mEq/L   Glucose, Bld 105 (H) 70 - 99 mg/dL   BUN 12 6 - 23 mg/dL   Creatinine, Ser 8.29 (H) 0.40 - 1.20 mg/dL   Total Bilirubin 0.5 0.2 - 1.2 mg/dL   Alkaline Phosphatase 75 39 - 117  U/L  AST 21 0 - 37 U/L   ALT 18 0 - 35 U/L   Total Protein 6.9 6.0 - 8.3 g/dL   Albumin 4.1 3.5 - 5.2 g/dL   GFR 11.91 (L) >47.82 mL/min   Calcium 9.1 8.4 - 10.5 mg/dL  CBC with Differential/Platelet  Result Value Ref Range   WBC 4.6 4.0 - 10.5 K/uL   RBC 4.49 3.87 - 5.11 Mil/uL   Hemoglobin 14.4 12.0 - 15.0 g/dL   HCT 95.6 21.3 - 08.6 %   MCV 96.3 78.0 - 100.0 fl   MCHC 33.3 30.0 - 36.0 g/dL   RDW 57.8 46.9 - 62.9 %   Platelets 284.0 150.0 - 400.0 K/uL   Neutrophils Relative % 54.5 43.0 - 77.0 %   Lymphocytes Relative 33.9 12.0 - 46.0 %   Monocytes Relative 10.2 3.0 - 12.0 %   Eosinophils Relative 0.8 0.0 - 5.0 %   Basophils Relative 0.6 0.0 - 3.0 %   Neutro Abs 2.5 1.4 - 7.7 K/uL   Lymphs Abs 1.6 0.7 - 4.0 K/uL   Monocytes Absolute 0.5 0.1 - 1.0 K/uL   Eosinophils Absolute 0.0 0.0 - 0.7 K/uL   Basophils Absolute 0.0 0.0 - 0.1 K/uL  Hemoglobin A1c  Result Value Ref Range   Hgb A1c MFr Bld 6.1 4.6 - 6.5 %      Assessment & Plan:    Routine Health Maintenance and Physical Exam  Problem List Items Addressed This Visit     CKD (chronic kidney disease)   Relevant Orders   CBC with Differential/Platelet (Completed)   Comprehensive metabolic panel with GFR (Completed)   Hyperlipidemia   Prediabetes   Relevant Orders   Hemoglobin A1c (Completed)   Severe obesity (BMI >= 40) (HCC)   Other Visit Diagnoses       Encounter for general adult medical examination with abnormal findings    -  Primary     Encounter for screening for other viral diseases       Relevant Orders   Hepatitis C antibody     Screening for tuberculosis       Relevant Orders   QuantiFERON-TB Gold Plus      Preventive health care reviewed.  Sees OB/GYN. Counseling on healthy lifestyle including diet and exercise.  Recommend regular dental and eye exams.  Immunizations reviewed.  Discussed safety. 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  Discussed CKD diagnosis and need to monitor and avoid  nephrotoxic medications. Check labs including A1c and follow up. Filled out form for school.    Return in about 6 months (around 07/10/2024).     Hetty Blend, NP-C

## 2024-01-10 ENCOUNTER — Encounter: Payer: Self-pay | Admitting: Family Medicine

## 2024-01-10 ENCOUNTER — Other Ambulatory Visit: Payer: Self-pay | Admitting: Family Medicine

## 2024-01-10 DIAGNOSIS — N1831 Chronic kidney disease, stage 3a: Secondary | ICD-10-CM

## 2024-01-12 LAB — HEPATITIS C ANTIBODY: Hepatitis C Ab: NONREACTIVE

## 2024-01-12 LAB — QUANTIFERON-TB GOLD PLUS
Mitogen-NIL: 7.49 [IU]/mL
NIL: 0.02 [IU]/mL
QuantiFERON-TB Gold Plus: NEGATIVE
TB1-NIL: 0.01 [IU]/mL
TB2-NIL: 0 [IU]/mL

## 2024-01-13 ENCOUNTER — Encounter: Payer: Self-pay | Admitting: Family Medicine

## 2024-01-13 NOTE — Progress Notes (Signed)
 Negative TB blood test. Ok to fill out form if needed.

## 2024-04-29 ENCOUNTER — Other Ambulatory Visit: Payer: Self-pay | Admitting: Nephrology

## 2024-04-29 DIAGNOSIS — N1831 Chronic kidney disease, stage 3a: Secondary | ICD-10-CM

## 2024-07-10 ENCOUNTER — Ambulatory Visit: Payer: PRIVATE HEALTH INSURANCE | Admitting: Family Medicine

## 2024-07-10 ENCOUNTER — Encounter: Payer: Self-pay | Admitting: Family Medicine

## 2024-07-10 VITALS — BP 114/80 | HR 90 | Temp 97.7°F | Ht 67.0 in | Wt 298.0 lb

## 2024-07-10 DIAGNOSIS — Z23 Encounter for immunization: Secondary | ICD-10-CM | POA: Diagnosis not present

## 2024-07-10 DIAGNOSIS — E7800 Pure hypercholesterolemia, unspecified: Secondary | ICD-10-CM | POA: Diagnosis not present

## 2024-07-10 DIAGNOSIS — Z6841 Body Mass Index (BMI) 40.0 and over, adult: Secondary | ICD-10-CM

## 2024-07-10 DIAGNOSIS — R7303 Prediabetes: Secondary | ICD-10-CM

## 2024-07-10 DIAGNOSIS — N1831 Chronic kidney disease, stage 3a: Secondary | ICD-10-CM

## 2024-07-10 NOTE — Patient Instructions (Signed)
 Check with your insurance regarding which facility would be best for your renal ultrasound.  Let [facility] know where to send the order.  I will see you back in 3 months fasting and we will check your labs at that time.

## 2024-07-10 NOTE — Progress Notes (Signed)
 Subjective:     Patient ID: Sarah Ayala, female    DOB: 04-Feb-1981, 43 y.o.   MRN: [id number]  Chief Complaint  Patient presents with   Medical Management of Chronic Issues    6 month f/u    HPI  Discussed the use of AI scribe software for clinical note transcription with the patient, who gave verbal consent to proceed.  History of Present Illness Sarah Ayala is a 43 year old female with prediabetes, severe obesity, and stage 3 chronic kidney disease who presents for follow-up on her chronic health conditions.  Chronic kidney disease - Stage 3 chronic kidney disease with stable renal function per recent nephrology blood work - Recommended kidney ultrasound not completed due to insurance coverage issues; actively working to resolve this - Taking Farxiga, though not listed on medication list, and will need to be added   Obesity and weight management - Severe obesity with gradual weight decrease - Increased physical activity: attends gym 2-3 times per week and walks during school breaks - Dietary changes implemented  Headache - Headaches have improved but persist - Attributed improvement to better hydration, regular sleep, and increased physical activity  Sleep quality and fatigue - Improved sleep quality - Less fatigue upon waking - Improved sleep and reduced headaches attributed to dietary changes and increased exercise  Constitutional and cardiopulmonary symptoms - No dizziness, chest pain, palpitations, or shortness of breath  Gastrointestinal symptoms - No gastrointestinal symptoms     Health Maintenance Due  Topic Date Due   Pneumococcal Vaccine (1 of 2 - PCV) Never done   Hepatitis B Vaccines 19-59 Average Risk (1 of 3 - 19+ 3-dose series) Never done   HPV VACCINES (1 - 3-dose SCDM series) Never done   Cervical Cancer Screening (HPV/Pap Cotest)  12/25/2015   Mammogram  Never done   COVID-19 Vaccine (4 - 2025-26 season) 06/02/2024    Past Medical  History:  Diagnosis Date   CKD (chronic kidney disease)    Hyperlipidemia    Prediabetes    Pregnancy induced hypertension    Preterm labor    Vitamin D  deficiency     Past Surgical History:  Procedure Laterality Date   BREAST REDUCTION SURGERY     BREAST SURGERY  08/2016   Breast rwsuction surgery   CESAREAN SECTION     CESAREAN SECTION N/A 11/18/2013   Procedure: CESAREAN SECTION;  Surgeon: Duwaine Blumenthal, DO;  Location: [facility];  Service: Obstetrics;  Laterality: N/A;    Family History  Problem Relation Age of Onset   Arthritis Mother    Depression Mother    Cancer Father    Arthritis Maternal Grandmother    Cancer Maternal Grandmother    Diabetes Maternal Grandmother    Asthma Daughter    Asthma Son    Asthma Son     Social History   Socioeconomic History   Marital status: Married    Spouse name: Not on file   Number of children: Not on file   Years of education: Not on file   Highest education level: Associate degree: occupational, Scientist, product/process development, or vocational program  Occupational History   Not on file  Tobacco Use   Smoking status: Never    Passive exposure: Current   Smokeless tobacco: Never  Substance and Sexual Activity   Alcohol use: Yes    Alcohol/week: 3.0 standard drinks of alcohol    Types: 3 Glasses of wine per week    Comment: occasionally  Drug use: No   Sexual activity: Yes    Birth control/protection: I.U.D., None    Comment: Mirena 2020  Other Topics Concern   Not on file  Social History Narrative   Not on file   Social Drivers of Health   Financial Resource Strain: Low Risk  (11/07/2023)   Overall Financial Resource Strain (CARDIA)    Difficulty of Paying Living Expenses: Not hard at all  Food Insecurity: No Food Insecurity (11/07/2023)   Hunger Vital Sign    Worried About Running Out of Food in the Last Year: Never true    Ran Out of Food in the Last Year: Never true  Transportation Needs: No Transportation Needs (11/07/2023)   PRAPARE  - Administrator, Civil Service (Medical): No    Lack of Transportation (Non-Medical): No  Physical Activity: Insufficiently Active (11/07/2023)   Exercise Vital Sign    Days of Exercise per Week: 2 days    Minutes of Exercise per Session: 20 min  Stress: No Stress Concern Present (11/07/2023)   [employer] of Occupational Health - Occupational Stress Questionnaire    Feeling of Stress : Not at all  Social Connections: Socially Integrated (11/07/2023)   Social Connection and Isolation Panel    Frequency of Communication with Friends and Family: More than three times a week    Frequency of Social Gatherings with Friends and Family: More than three times a week    Attends Religious Services: More than 4 times per year    Active Member of [employer] or Organizations: Yes    Attends Engineer, structural: More than 4 times per year    Marital Status: Married  Catering manager Violence: Not on file    Outpatient Medications Prior to Visit  Medication Sig Dispense Refill   cetirizine (ZYRTEC) 10 MG chewable tablet Chew 10 mg by mouth 2 (two) times daily.     Cyanocobalamin  (B-12 PO) Take by mouth.     EPINEPHrine  (EPIPEN  2-PAK) 0.3 mg/0.3 mL IJ SOAJ injection Inject 0.3 mg into the muscle as needed for anaphylaxis. 2 each 1   Famotidine  (PEPCID  AC PO) Take by mouth 2 (two) times daily.     FARXIGA 10 MG TABS tablet Take 10 mg by mouth daily.     levonorgestrel (MIRENA, 52 MG,) 20 MCG/DAY IUD Take by intrauterine route.     VITAMIN D , CHOLECALCIFEROL, PO Take by mouth. D3     No facility-administered medications prior to visit.    No Known Allergies  Review of Systems  Constitutional:  Positive for weight loss. Negative for chills and fever.       Intentional   Respiratory:  Negative for shortness of breath.   Cardiovascular:  Negative for chest pain, palpitations and leg swelling.  Gastrointestinal:  Negative for abdominal pain, constipation, diarrhea, nausea and  vomiting.  Genitourinary:  Negative for dysuria, frequency and urgency.  Neurological:  Positive for headaches. Negative for dizziness and focal weakness.       Headaches less frequent        Objective:    Physical Exam Constitutional:      General: She is not in acute distress.    Appearance: She is not ill-appearing.  Eyes:     Extraocular Movements: Extraocular movements intact.     Conjunctiva/sclera: Conjunctivae normal.  Cardiovascular:     Rate and Rhythm: Normal rate.  Pulmonary:     Effort: Pulmonary effort is normal.  Musculoskeletal:  Cervical back: Normal range of motion and neck supple.  Skin:    General: Skin is warm and dry.  Neurological:     General: No focal deficit present.     Mental Status: She is alert and oriented to person, place, and time.  Psychiatric:        Mood and Affect: Mood normal.        Behavior: Behavior normal.        Thought Content: Thought content normal.      BP 114/80   Pulse 90   Temp 97.7 F (36.5 C) (Temporal)   Ht 5' 7 (1.702 m)   Wt 298 lb (135.2 kg)   SpO2 99%   Breastfeeding No   BMI 46.67 kg/m  Wt Readings from Last 3 Encounters:  07/10/24 298 lb (135.2 kg)  01/09/24 (!) 304 lb (137.9 kg)  11/07/23 (!) 300 lb 9.6 oz (136.4 kg)       Assessment & Plan:   Problem List Items Addressed This Visit     CKD (chronic kidney disease)   Prediabetes - Primary   Other Visit Diagnoses       Need for influenza vaccination       Relevant Orders   Flu vaccine trivalent PF, 6mos and older(Flulaval,Afluria,Fluarix,Fluzone) (Completed)       Assessment and Plan Assessment & Plan Chronic kidney disease, stage 3 Chronic kidney disease stage 3 is well-managed according to nephrology evaluation. Blood work indicates stable kidney function. A renal ultrasound was ordered but not yet completed due to insurance coverage issues. Farxiga has been prescribed and is being taken, which may benefit both blood sugar control  and kidney function. Emphasized the importance of monitoring kidney health due to her age.  Engineer, maintenance (IT) insurance to determine coverage for renal ultrasound and inform the office to change the order accordingly - Continue Farxiga as prescribed - Follow up with nephrology annually unless otherwise indicated - Declines labs today   Prediabetes Prediabetes is being managed with lifestyle modifications including increased physical activity and dietary changes. The last A1c was checked six months ago. She is hesitant to repeat labs due to insurance and billing issues but plans to address this with potential new insurance coverage. - Recheck A1c in three months during follow-up visit - Continue lifestyle modifications including regular exercise and healthy diet  Severe obesity Severe obesity is being addressed through lifestyle changes. She reports weight loss due to increased physical activity and dietary improvements. She is attending the gym two to three times a week and walking during breaks at school. - Continue current exercise regimen and dietary changes     I am having Merlie L. Daughtrey maintain her Mirena (52 MG), (VITAMIN D , CHOLECALCIFEROL, PO), Cyanocobalamin  (B-12 PO), EPINEPHrine , cetirizine, Famotidine  (PEPCID  AC PO), and Farxiga.  No orders of the defined types were placed in this encounter.

## 2024-10-10 ENCOUNTER — Ambulatory Visit: Payer: PRIVATE HEALTH INSURANCE | Admitting: Family Medicine

## 2024-11-20 ENCOUNTER — Ambulatory Visit: Payer: PRIVATE HEALTH INSURANCE | Admitting: Family Medicine
